# Patient Record
Sex: Female | Born: 1935 | Race: White | Hispanic: No | Smoking: Never smoker
Health system: Southern US, Community
[De-identification: ages and names within clinical notes are randomized; demographics above are authoritative.]

---

## 1998-03-10 ENCOUNTER — Ambulatory Visit: Admission: RE | Admit: 1998-03-10 | Discharge: 1998-03-10 | Payer: Self-pay | Admitting: Obstetrics & Gynecology

## 2005-05-17 ENCOUNTER — Ambulatory Visit: Payer: Self-pay | Admitting: Family Medicine

## 2005-11-02 ENCOUNTER — Ambulatory Visit: Payer: Self-pay | Admitting: Family Medicine

## 2006-11-28 ENCOUNTER — Ambulatory Visit: Payer: Self-pay

## 2006-12-09 ENCOUNTER — Ambulatory Visit: Payer: Self-pay

## 2006-12-10 ENCOUNTER — Ambulatory Visit: Payer: Self-pay | Admitting: Family Medicine

## 2006-12-26 ENCOUNTER — Ambulatory Visit: Payer: Self-pay | Admitting: Surgery

## 2007-01-09 ENCOUNTER — Other Ambulatory Visit: Payer: Self-pay

## 2007-01-15 ENCOUNTER — Inpatient Hospital Stay: Payer: Self-pay | Admitting: Surgery

## 2007-11-10 ENCOUNTER — Ambulatory Visit: Payer: Self-pay | Admitting: Family Medicine

## 2007-12-22 ENCOUNTER — Ambulatory Visit: Payer: Self-pay | Admitting: Family Medicine

## 2008-12-29 ENCOUNTER — Ambulatory Visit: Payer: Self-pay | Admitting: Family Medicine

## 2011-02-07 ENCOUNTER — Ambulatory Visit: Payer: Self-pay | Admitting: Family Medicine

## 2011-09-25 ENCOUNTER — Other Ambulatory Visit: Payer: Self-pay | Admitting: Otolaryngology

## 2011-09-25 DIAGNOSIS — H9209 Otalgia, unspecified ear: Secondary | ICD-10-CM

## 2011-09-26 ENCOUNTER — Ambulatory Visit
Admission: RE | Admit: 2011-09-26 | Discharge: 2011-09-26 | Disposition: A | Discharge status: Home / Self Care | Payer: Medicare Other | Source: Ambulatory Visit | Attending: Otolaryngology | Admitting: Otolaryngology

## 2011-09-26 DIAGNOSIS — H9209 Otalgia, unspecified ear: Secondary | ICD-10-CM

## 2011-09-26 MED ORDER — IOHEXOL 300 MG/ML  SOLN
75.0000 mL | Freq: Once | INTRAMUSCULAR | Status: AC | PRN
  Administered 2011-09-26: 75 mL via INTRAVENOUS

## 2011-10-12 ENCOUNTER — Other Ambulatory Visit: Payer: Self-pay | Admitting: Otolaryngology

## 2011-10-12 DIAGNOSIS — H7010 Chronic mastoiditis, unspecified ear: Secondary | ICD-10-CM

## 2011-10-12 DIAGNOSIS — H9209 Otalgia, unspecified ear: Secondary | ICD-10-CM

## 2011-10-15 ENCOUNTER — Ambulatory Visit
Admission: RE | Admit: 2011-10-15 | Discharge: 2011-10-15 | Disposition: A | Discharge status: Home / Self Care | Payer: Medicare Other | Source: Ambulatory Visit | Attending: Otolaryngology | Admitting: Otolaryngology

## 2011-10-15 DIAGNOSIS — H9209 Otalgia, unspecified ear: Secondary | ICD-10-CM

## 2011-10-15 DIAGNOSIS — H7010 Chronic mastoiditis, unspecified ear: Secondary | ICD-10-CM

## 2011-10-15 MED ORDER — GADOBENATE DIMEGLUMINE 529 MG/ML IV SOLN
13.0000 mL | Freq: Once | INTRAVENOUS | Status: AC | PRN
  Administered 2011-10-15: 13 mL via INTRAVENOUS

## 2011-10-29 ENCOUNTER — Ambulatory Visit: Payer: Self-pay | Admitting: Family Medicine

## 2012-03-12 ENCOUNTER — Inpatient Hospital Stay: Payer: Self-pay | Admitting: Internal Medicine

## 2012-03-12 LAB — COMPREHENSIVE METABOLIC PANEL
Alkaline Phosphatase: 48 U/L — ABNORMAL LOW (ref 50–136)
Anion Gap: 12 (ref 7–16)
Calcium, Total: 9.3 mg/dL (ref 8.5–10.1)
Co2: 26 mmol/L (ref 21–32)
EGFR (African American): >60
EGFR (Non-African Amer.): 58 — ABNORMAL LOW
Osmolality: 276 (ref 275–301)
SGOT(AST): 27 U/L (ref 15–37)
SGPT (ALT): 17 U/L

## 2012-03-12 LAB — CBC WITH DIFFERENTIAL/PLATELET
Basophil #: 0 10*3/uL (ref 0.0–0.1)
Eosinophil #: 0.2 10*3/uL (ref 0.0–0.7)
HCT: 41.3 % (ref 35.0–47.0)
Lymphocyte #: 2.2 10*3/uL (ref 1.0–3.6)
MCHC: 33.7 g/dL (ref 32.0–36.0)
MCV: 87 fL (ref 80–100)
Monocyte #: 1.1 10*3/uL — ABNORMAL HIGH (ref 0.0–0.7)
Monocyte %: 9.5 %
Neutrophil #: 7.8 10*3/uL — ABNORMAL HIGH (ref 1.4–6.5)
Platelet: 448 10*3/uL — ABNORMAL HIGH (ref 150–440)
RDW: 12.8 % (ref 11.5–14.5)
WBC: 11.3 10*3/uL — ABNORMAL HIGH (ref 3.6–11.0)

## 2012-03-13 LAB — CBC WITH DIFFERENTIAL/PLATELET
Basophil #: 0 10*3/uL (ref 0.0–0.1)
Eosinophil #: 0.3 10*3/uL (ref 0.0–0.7)
HCT: 40.4 % (ref 35.0–47.0)
HGB: 13.6 g/dL (ref 12.0–16.0)
Lymphocyte %: 19.9 %
Monocyte %: 11.4 %
Neutrophil #: 7.1 10*3/uL — ABNORMAL HIGH (ref 1.4–6.5)
Neutrophil %: 66 %
RDW: 12.4 % (ref 11.5–14.5)
WBC: 10.7 10*3/uL (ref 3.6–11.0)

## 2012-03-13 LAB — BASIC METABOLIC PANEL
Calcium, Total: 9.5 mg/dL (ref 8.5–10.1)
EGFR (Non-African Amer.): >60
Glucose: 125 mg/dL — ABNORMAL HIGH (ref 65–99)
Osmolality: 282 (ref 275–301)
Potassium: 3.9 mmol/L (ref 3.5–5.1)

## 2012-03-19 DIAGNOSIS — G629 Polyneuropathy, unspecified: Secondary | ICD-10-CM

## 2012-03-19 DIAGNOSIS — Z9089 Acquired absence of other organs: Secondary | ICD-10-CM

## 2012-03-19 DIAGNOSIS — E119 Type 2 diabetes mellitus without complications: Secondary | ICD-10-CM | POA: Insufficient documentation

## 2012-03-19 DIAGNOSIS — H8309 Labyrinthitis, unspecified ear: Secondary | ICD-10-CM | POA: Insufficient documentation

## 2012-03-19 DIAGNOSIS — E785 Hyperlipidemia, unspecified: Secondary | ICD-10-CM | POA: Insufficient documentation

## 2012-03-19 DIAGNOSIS — Z720 Tobacco use: Secondary | ICD-10-CM | POA: Insufficient documentation

## 2012-03-19 DIAGNOSIS — I10 Essential (primary) hypertension: Secondary | ICD-10-CM

## 2012-03-20 ENCOUNTER — Ambulatory Visit (HOSPITAL_COMMUNITY)
Admission: RE | Admit: 2012-03-20 | Discharge: 2012-03-20 | Disposition: A | Discharge status: Home / Self Care | Payer: Medicare Other | Source: Ambulatory Visit | Attending: Internal Medicine | Admitting: Internal Medicine

## 2012-03-20 ENCOUNTER — Other Ambulatory Visit: Payer: Self-pay | Admitting: Licensed Clinical Social Worker

## 2012-03-20 ENCOUNTER — Ambulatory Visit (INDEPENDENT_AMBULATORY_CARE_PROVIDER_SITE_OTHER): Payer: Medicare Other | Admitting: Internal Medicine

## 2012-03-20 ENCOUNTER — Other Ambulatory Visit: Payer: Self-pay | Admitting: Internal Medicine

## 2012-03-20 VITALS — BP 115/71 | HR 81 | Temp 97.7°F | Ht 63.0 in | Wt 133.5 lb

## 2012-03-20 DIAGNOSIS — L0390 Cellulitis, unspecified: Secondary | ICD-10-CM

## 2012-03-20 DIAGNOSIS — H8309 Labyrinthitis, unspecified ear: Secondary | ICD-10-CM

## 2012-03-20 DIAGNOSIS — L0291 Cutaneous abscess, unspecified: Secondary | ICD-10-CM | POA: Insufficient documentation

## 2012-03-20 NOTE — Procedures (Signed)
Procedure : RT SL PICC.  41 cm in length with tip at cavoatrial junction Specimen : none Complications : none

## 2012-03-20 NOTE — Progress Notes (Signed)
  Subjective:    Patient ID: Heidi [name], female    DOB: March 22, 1927, 78 y.o.   MRN: [id number]  HPI Here for periauricular cellulitis.  She has a history of a cholestoma and history of left radical mastoidectomy at age 60 at [facility] and Dr. Ermalinda Barrios did a revision mealoplasty on August 19 2008.  Had recurrent cholesteatoma and revision of left radical mastoidectomy Nov 27 2011.  Very slow to heal and has had difficulty with left auricular cellulitis.  She has been on cipro and gent drops and cipro po for quite a while.  She was recently in [place] and started on vancomycin and aztreonam and noted some improvement and was switched to po therapy and she was discharged.  Unfortunately, she has progressed again and has significant discharge and erythema again of the ear.  Recent CT by their report and from Dr. Dorma Russell is no abscess or bone involvement.     She does have a penicillin allergy but is described as a rash, NOT HIVES.  No known history of cephalosporin use to their knowledge.     Review of Systems  Constitutional: Negative for fever, chills, fatigue and unexpected weight change.  HENT: Positive for ear pain and ear discharge. Negative for facial swelling, neck stiffness, dental problem and tinnitus.   Respiratory: Negative for cough and shortness of breath.   Cardiovascular: Negative for chest pain, palpitations and leg swelling.       Objective:   Physical Exam  Constitutional: She appears well-developed and well-nourished. She appears distressed.       Pain from ear  HENT:       Severe periauricular erythema, pus-like discharge in canal.  Very tender.          Assessment & Plan:

## 2012-03-20 NOTE — Assessment & Plan Note (Signed)
I will start her on cefepime 2 grams bid.  I am most concerned with pseudomonas though MRSA still a possibility.  She will continue with the doxycycline, start the IV med today.  She does not by history have a severe (hives) allergy to penicillin so little cross reactivity.  I am using cefepime for broader coverage including staph and strep as well as GN.  She will be reevaluated on Monday and told to call if she develops a rash.  The next option will be aztreonam if rash develops.  PiCC today at 1:30 and first dose by home health at patients home today.

## 2012-03-23 ENCOUNTER — Telehealth (HOSPITAL_COMMUNITY): Payer: Self-pay | Admitting: Radiology

## 2012-03-24 ENCOUNTER — Telehealth (HOSPITAL_COMMUNITY): Payer: Self-pay

## 2012-03-24 ENCOUNTER — Encounter: Payer: Self-pay | Admitting: Internal Medicine

## 2012-03-24 ENCOUNTER — Ambulatory Visit (INDEPENDENT_AMBULATORY_CARE_PROVIDER_SITE_OTHER): Payer: Medicare Other | Admitting: Internal Medicine

## 2012-03-24 VITALS — BP 165/81 | HR 96 | Temp 98.2°F | Ht 63.0 in | Wt 130.0 lb

## 2012-03-24 DIAGNOSIS — H8309 Labyrinthitis, unspecified ear: Secondary | ICD-10-CM

## 2012-03-24 NOTE — Progress Notes (Signed)
  Subjective:    Patient ID: Heidi [name], female    DOB: 26-Jul-1927, 78 y.o.   MRN: [id number]  HPI Here for follow up of malignant otitis externa, periauricular cellulitis.  No fever or chills.  Started on cefepime at 2 grams q12 hours.  Still with significant pain but did feel that she improved the first day.  No rashes and tolerating the antibiotic well.  History of penicillin allergy but rash, not hives (not severe allergy) and tolerating cephalosporin well.     Review of Systems  Constitutional: Negative for fever and chills.  HENT: Positive for ear discharge.        Still with significant ear pain  Gastrointestinal: Negative for abdominal pain and diarrhea.  Skin: Negative for rash.       Objective:   Physical Exam  Constitutional: She appears well-developed and well-nourished.       Moderate distress with pain  HENT:       Improved area of erythema on the outer part of the ear. Still with signifcant erythema and swelling though.    Skin: No rash noted.          Assessment & Plan:

## 2012-03-24 NOTE — Assessment & Plan Note (Signed)
I am pleased that it is slowly responding to therapy.  I have though asked to do q 8 dosing to try to increase the drug level to the ear and the family is more than willing to do that.  They do have an appointment with Dr. Dorma Russell on Thursday and if continued slow improvement, will continue with the same, if not, I will consider adding vancomycin.  She will return next Wed, sooner though if needed or Friday if not improving.     Also, no signs of allergy.

## 2012-03-31 ENCOUNTER — Encounter: Payer: Self-pay | Admitting: Internal Medicine

## 2012-03-31 ENCOUNTER — Telehealth: Payer: Self-pay | Admitting: *Deleted

## 2012-03-31 ENCOUNTER — Ambulatory Visit (INDEPENDENT_AMBULATORY_CARE_PROVIDER_SITE_OTHER): Payer: Medicare Other | Admitting: Internal Medicine

## 2012-03-31 VITALS — BP 138/74 | HR 76 | Temp 98.0°F | Ht 63.0 in | Wt 131.0 lb

## 2012-03-31 DIAGNOSIS — H8309 Labyrinthitis, unspecified ear: Secondary | ICD-10-CM

## 2012-03-31 MED ORDER — CIPROFLOXACIN-DEXAMETHASONE 0.3-0.1 % OT SUSP: 3.0000 [drp] | Freq: Three times a day (TID) | OTIC | Status: DC

## 2012-03-31 NOTE — Telephone Encounter (Signed)
Gave verbal orders to [place] at [facility] to extend IV antibiotics for one month per Dr. Luciana Axe. Wendall Mola CMA

## 2012-04-01 NOTE — Progress Notes (Signed)
  Subjective:    Patient ID: Heidi [name], female    DOB: Nov 27, 1927, 78 y.o.   MRN: [id number]  HPI here for follow up of her malignant otitis media and periauricular cellulitis.  It continues to be painful but the patient is in much better spirits today.  No fever, chills.  Continues on TID cefepime.  No rashes.     Review of Systems  Constitutional: Negative for fever and chills.  Skin: Negative for rash.       Improving cellulitis       Objective:   Physical Exam  HENT:       Left ear with decreased intensity of the erythema but persists, some decrease in the edema.           Assessment & Plan:

## 2012-04-01 NOTE — Assessment & Plan Note (Signed)
She continues to slowly improve.  She will continue with TID cefepime for now and at the next visit, will change back to BID dosing.     I have discussed the case with colleagues and wound care center, no role or indication for hyperbaric therapy.     She will see ENT next week and me the week following.   She likely will need a 2-3 month course of IV cefepime at least.    I refilled the ciprodex.

## 2012-04-02 ENCOUNTER — Other Ambulatory Visit: Payer: Self-pay | Admitting: *Deleted

## 2012-04-07 ENCOUNTER — Encounter: Payer: Self-pay | Admitting: Internal Medicine

## 2012-04-15 ENCOUNTER — Encounter: Payer: Self-pay | Admitting: Internal Medicine

## 2012-04-15 ENCOUNTER — Ambulatory Visit (INDEPENDENT_AMBULATORY_CARE_PROVIDER_SITE_OTHER): Payer: Medicare Other | Admitting: Internal Medicine

## 2012-04-15 VITALS — BP 124/73 | HR 78 | Temp 97.7°F | Ht 63.0 in | Wt 131.0 lb

## 2012-04-15 DIAGNOSIS — H8309 Labyrinthitis, unspecified ear: Secondary | ICD-10-CM

## 2012-04-15 NOTE — Progress Notes (Signed)
  Subjective:    Patient ID: Heidi [name], female    DOB: 1927-04-22, 78 y.o.   MRN: [id number]  HPI She comes in for followup of her left ear malignant cellulitis. She has been on cefepime now for about one month, including 3 times a day over the last 2 weeks. She continues to follow with ENT weekly, Dr. Tia Masker, and he feels that she has continued to improve. She denies any fever or chills. She continues to feel better subjectively as well. She is accompanied by HER-2 daughters. She denies any fever, chills or rash.   Review of Systems  Constitutional: Negative for fever and chills.  Gastrointestinal: Negative for nausea, vomiting and diarrhea.  Skin: Negative for rash.  Psychiatric/Behavioral: Negative for dysphoric mood. The patient is not nervous/anxious.        Objective:   Physical Exam  Constitutional: She appears well-developed and well-nourished. No distress.  HENT:       Left ear with decreased erythema, decreased edema  Skin: No rash noted.          Assessment & Plan:

## 2012-04-15 NOTE — Assessment & Plan Note (Signed)
This continues to slowly improve. I did discuss with her that often the IV antibiotics require a three-month course at least. That this is a slow healing process.  At the end of 3 months if it is completely resolved or significantly improved, she may be able to be switched to oral therapy again with Cipro. I will have her return in one month and she does get a weekly followup with ENT. I have told them that they can go back to twice a day cefepime.

## 2012-04-16 ENCOUNTER — Encounter: Payer: Self-pay | Admitting: Internal Medicine

## 2012-04-24 ENCOUNTER — Other Ambulatory Visit: Payer: Self-pay | Admitting: Internal Medicine

## 2012-04-30 ENCOUNTER — Telehealth: Payer: Self-pay | Admitting: *Deleted

## 2012-04-30 NOTE — Telephone Encounter (Signed)
Heidi [name] with [facility] called to clarify the patient dose frequency and stop date of her IV antibiotic. After review of the patient last 2 office notes told he she was back to twice a day and provider intended for the patient to stay on the IV antibiotic at least 2-3 months. Having an original start date of 03/21/12 that would put her stop date around 05/21/12 ( ) or 06/21/12 ( ). Noted that the patient has an appt 05/15/12 at which time this will be reassesed and if anything changed we would give her office a call.

## 2012-05-05 ENCOUNTER — Encounter: Payer: Self-pay | Admitting: Internal Medicine

## 2012-05-09 ENCOUNTER — Encounter: Payer: Self-pay | Admitting: Internal Medicine

## 2012-05-12 ENCOUNTER — Inpatient Hospital Stay: Payer: Self-pay | Admitting: Internal Medicine

## 2012-05-12 LAB — COMPREHENSIVE METABOLIC PANEL
Albumin: 2.7 g/dL — ABNORMAL LOW (ref 3.4–5.0)
Alkaline Phosphatase: 43 U/L — ABNORMAL LOW (ref 50–136)
Anion Gap: 10 (ref 7–16)
BUN: 37 mg/dL — ABNORMAL HIGH (ref 7–18)
Calcium, Total: 7.7 mg/dL — ABNORMAL LOW (ref 8.5–10.1)
Chloride: 116 mmol/L — ABNORMAL HIGH (ref 98–107)
Co2: 21 mmol/L (ref 21–32)
Creatinine: 1.81 mg/dL — ABNORMAL HIGH (ref 0.60–1.30)
EGFR (African American): 30 — ABNORMAL LOW
EGFR (Non-African Amer.): 26 — ABNORMAL LOW
Glucose: 111 mg/dL — ABNORMAL HIGH (ref 65–99)
Osmolality: 302 (ref 275–301)
Potassium: 4 mmol/L (ref 3.5–5.1)
SGOT(AST): 24 U/L (ref 15–37)
SGPT (ALT): 12 U/L
Sodium: 147 mmol/L — ABNORMAL HIGH (ref 136–145)
Total Protein: 6.5 g/dL (ref 6.4–8.2)

## 2012-05-12 LAB — CBC
HGB: 10.8 g/dL — ABNORMAL LOW (ref 12.0–16.0)
MCH: 27.7 pg (ref 26.0–34.0)
MCHC: 32 g/dL (ref 32.0–36.0)
RBC: 3.91 10*6/uL (ref 3.80–5.20)
RDW: 14.9 % — ABNORMAL HIGH (ref 11.5–14.5)
WBC: 7.2 10*3/uL (ref 3.6–11.0)

## 2012-05-12 LAB — URINALYSIS, COMPLETE
Bacteria: NONE SEEN
Bilirubin,UR: NEGATIVE
Blood: NEGATIVE
Glucose,UR: NEGATIVE mg/dL (ref 0–75)
Ketone: NEGATIVE
Nitrite: NEGATIVE
Ph: 6 (ref 4.5–8.0)
RBC,UR: 1 /HPF (ref 0–5)
Specific Gravity: 1.011 (ref 1.003–1.030)
Squamous Epithelial: <1
WBC UR: 2 /HPF (ref 0–5)

## 2012-05-12 LAB — TSH: Thyroid Stimulating Horm: 7.28 u[IU]/mL — ABNORMAL HIGH

## 2012-05-12 LAB — TROPONIN I: Troponin-I: <0.02 ng/mL

## 2012-05-12 LAB — CK TOTAL AND CKMB (NOT AT ARMC): CK, Total: 21 U/L (ref 21–215)

## 2012-05-13 ENCOUNTER — Telehealth: Payer: Self-pay | Admitting: *Deleted

## 2012-05-13 LAB — BASIC METABOLIC PANEL
Calcium, Total: 7.8 mg/dL — ABNORMAL LOW (ref 8.5–10.1)
Chloride: 114 mmol/L — ABNORMAL HIGH (ref 98–107)
Co2: 22 mmol/L (ref 21–32)
Creatinine: 1.75 mg/dL — ABNORMAL HIGH (ref 0.60–1.30)
EGFR (Non-African Amer.): 27 — ABNORMAL LOW
Glucose: 106 mg/dL — ABNORMAL HIGH (ref 65–99)
Potassium: 4.2 mmol/L (ref 3.5–5.1)
Sodium: 144 mmol/L (ref 136–145)

## 2012-05-13 NOTE — Telephone Encounter (Signed)
Patient's daughter called she is currently admitted at [facility], for ongoing nausea and left sided facial swelling.  Not sure if she will be discharged by Thursday when she has follow up with Dr. Luciana Axe.  Advised her to sign a release so the records could be sent to this clinic.   She will call if Mother can not keep appointment. Wendall Mola CMA

## 2012-05-14 LAB — BASIC METABOLIC PANEL
Calcium, Total: 7.4 mg/dL — ABNORMAL LOW (ref 8.5–10.1)
Chloride: 115 mmol/L — ABNORMAL HIGH (ref 98–107)
Creatinine: 1.56 mg/dL — ABNORMAL HIGH (ref 0.60–1.30)
EGFR (African American): 35 — ABNORMAL LOW
Potassium: 3.8 mmol/L (ref 3.5–5.1)
Sodium: 146 mmol/L — ABNORMAL HIGH (ref 136–145)

## 2012-05-15 ENCOUNTER — Ambulatory Visit: Payer: Medicare Other | Admitting: Internal Medicine

## 2012-05-15 DIAGNOSIS — I517 Cardiomegaly: Secondary | ICD-10-CM

## 2012-05-15 LAB — BASIC METABOLIC PANEL
Anion Gap: 8 (ref 7–16)
BUN: 30 mg/dL — ABNORMAL HIGH (ref 7–18)
Creatinine: 1.55 mg/dL — ABNORMAL HIGH (ref 0.60–1.30)
EGFR (Non-African Amer.): 31 — ABNORMAL LOW
Glucose: 177 mg/dL — ABNORMAL HIGH (ref 65–99)

## 2012-05-17 LAB — BASIC METABOLIC PANEL
BUN: 29 mg/dL — ABNORMAL HIGH (ref 7–18)
Calcium, Total: 7.7 mg/dL — ABNORMAL LOW (ref 8.5–10.1)
Co2: 23 mmol/L (ref 21–32)
Creatinine: 1.15 mg/dL (ref 0.60–1.30)
EGFR (African American): 51 — ABNORMAL LOW

## 2012-05-19 ENCOUNTER — Other Ambulatory Visit: Payer: Self-pay

## 2012-05-19 LAB — COMPREHENSIVE METABOLIC PANEL
BUN: 14 mg/dL (ref 7–18)
Bilirubin,Total: 0.5 mg/dL (ref 0.2–1.0)
Calcium, Total: 7.9 mg/dL — ABNORMAL LOW (ref 8.5–10.1)
Chloride: 108 mmol/L — ABNORMAL HIGH (ref 98–107)
Co2: 28 mmol/L (ref 21–32)
Creatinine: 0.88 mg/dL (ref 0.60–1.30)
EGFR (Non-African Amer.): >60
Osmolality: 292 (ref 275–301)
Potassium: 3 mmol/L — ABNORMAL LOW (ref 3.5–5.1)
Total Protein: 6.5 g/dL (ref 6.4–8.2)

## 2012-05-19 LAB — CBC WITH DIFFERENTIAL/PLATELET
Eosinophil #: 0.3 10*3/uL (ref 0.0–0.7)
Eosinophil %: 5.1 %
Lymphocyte #: 1.1 10*3/uL (ref 1.0–3.6)
Lymphocyte %: 17.7 %
MCHC: 32 g/dL (ref 32.0–36.0)
MCV: 86 fL (ref 80–100)
Monocyte #: 0.7 x10 3/mm (ref 0.2–0.9)
Neutrophil #: 4 10*3/uL (ref 1.4–6.5)
Platelet: 266 10*3/uL (ref 150–440)
RBC: 3.64 10*6/uL — ABNORMAL LOW (ref 3.80–5.20)
RDW: 15.3 % — ABNORMAL HIGH (ref 11.5–14.5)

## 2012-05-19 LAB — CK: CK, Total: 50 U/L (ref 21–215)

## 2012-05-20 ENCOUNTER — Telehealth: Payer: Self-pay | Admitting: *Deleted

## 2012-05-20 NOTE — Telephone Encounter (Signed)
Heidi [name], pt's dtr called [phone] . States her mom got out of hospital Saturday and she was to be seen within 1-2 days for f/u. She has an appt in [place] tomorrow & wants Dr. Luciana Axe to see her so she does not have to make 2 trips here from [place]. Our md does not have anything until Friday am. messge to md to see if he wants her seen tomorrow or give her the Friday slot.   She states her mom is very weak & does not think she can make it 2 days of trips out of the house

## 2012-05-20 NOTE — Telephone Encounter (Signed)
We have an opening tomorrow at 4pm. Heidi [name] will put her in this slot. I spoke with the pt's dtr & she said that was ok. She will just bring her back to [place] by 4pm tomorrow

## 2012-05-21 ENCOUNTER — Telehealth: Payer: Self-pay | Admitting: *Deleted

## 2012-05-21 ENCOUNTER — Encounter: Payer: Self-pay | Admitting: Internal Medicine

## 2012-05-21 ENCOUNTER — Ambulatory Visit: Payer: Medicare Other | Admitting: Internal Medicine

## 2012-05-21 ENCOUNTER — Ambulatory Visit (INDEPENDENT_AMBULATORY_CARE_PROVIDER_SITE_OTHER): Payer: Medicare Other | Admitting: Internal Medicine

## 2012-05-21 VITALS — BP 182/70 | HR 77 | Temp 98.2°F | Ht 63.0 in | Wt 129.0 lb

## 2012-05-21 DIAGNOSIS — H6090 Unspecified otitis externa, unspecified ear: Secondary | ICD-10-CM

## 2012-05-21 DIAGNOSIS — H60399 Other infective otitis externa, unspecified ear: Secondary | ICD-10-CM

## 2012-05-21 DIAGNOSIS — H6020 Malignant otitis externa, unspecified ear: Secondary | ICD-10-CM | POA: Insufficient documentation

## 2012-05-21 NOTE — Assessment & Plan Note (Signed)
This has markedly improved since her last visit and I am pleased with the response. Since her creatinine has improved to normal I will readjust her cefepime dose to the full dose of 2 mg twice a day. Certainly with the improvement I am pleased and I do feel this is a right therapy, though I will continue with the cefepime for another month. I also will arrange for a gadolinium scan to be done in about 3 weeks to see if there is residual inflammation and how much. If that is improved in a month I may consider stopping the IV therapy and just continue with oral Cipro. I have discussed this plan with the daughters and the patient.

## 2012-05-21 NOTE — Progress Notes (Signed)
  Subjective:    Patient ID: Heidi [name], female    DOB: [birth year], 78 y.o.   MRN: [id number]  HPI She comes in for followup of malignant otitis externa. She was recently hospitalized due to dehydration and acute renal failure. Her creatinine now though is back in the normal range after hospitalization in [place] and hydration. She continues to cefepime though I had him stop the doxycycline to 2 nausea. She denies fever or chills. She is accompanied by HER-2 daughters. She continues to take the cefepime. This was renally adjusted during the hospitalization. She does have problems with abdominal pain which she is receiving Reglan and Zofran 4.   Review of Systems  Constitutional: Positive for fatigue. Negative for fever, chills and unexpected weight change.  HENT: Positive for hearing loss, ear pain and ear discharge.        Improved ear pain and discharge  Eyes: Negative.   Gastrointestinal: Positive for nausea, abdominal pain and constipation. Negative for vomiting and diarrhea.  Skin: Negative for rash.       Improved ear erythema       Objective:   Physical Exam  Constitutional: She appears well-developed and well-nourished. No distress.  HENT:       Erythema of the outer ear and surrounding her inner ear is much improved.          Assessment & Plan:

## 2012-05-21 NOTE — Telephone Encounter (Signed)
Called [facility] and spoke with Melissa in pharmacy to change IV antibiotic orders from every 8 hours to twice daily per Dr. Luciana Axe.  Extended orders for one month until patient's follow up appointment. Wendall Mola CMA

## 2012-06-07 ENCOUNTER — Other Ambulatory Visit: Payer: Self-pay

## 2012-06-07 LAB — COMPREHENSIVE METABOLIC PANEL
Albumin: 3.2 g/dL — ABNORMAL LOW (ref 3.4–5.0)
Anion Gap: 8 (ref 7–16)
Calcium, Total: 8.7 mg/dL (ref 8.5–10.1)
Co2: 25 mmol/L (ref 21–32)
EGFR (Non-African Amer.): >60
Glucose: 108 mg/dL — ABNORMAL HIGH (ref 65–99)
Osmolality: 280 (ref 275–301)
Potassium: 5.3 mmol/L — ABNORMAL HIGH (ref 3.5–5.1)
Sodium: 139 mmol/L (ref 136–145)
Total Protein: 7 g/dL (ref 6.4–8.2)

## 2012-06-07 LAB — CBC WITH DIFFERENTIAL/PLATELET
Basophil #: 0.1 10*3/uL (ref 0.0–0.1)
Basophil %: 0.7 %
Eosinophil %: 6 %
HCT: 34.8 % — ABNORMAL LOW (ref 35.0–47.0)
MCH: 28.1 pg (ref 26.0–34.0)
MCHC: 32.5 g/dL (ref 32.0–36.0)
MCV: 87 fL (ref 80–100)
Monocyte #: 0.9 x10 3/mm (ref 0.2–0.9)
Monocyte %: 11.5 %
Neutrophil #: 4.7 10*3/uL (ref 1.4–6.5)
Platelet: 345 10*3/uL (ref 150–440)
RDW: 16.2 % — ABNORMAL HIGH (ref 11.5–14.5)
WBC: 7.9 10*3/uL (ref 3.6–11.0)

## 2012-06-07 LAB — SEDIMENTATION RATE: Erythrocyte Sed Rate: 13 mm/hr (ref 0–30)

## 2012-06-10 ENCOUNTER — Telehealth: Payer: Self-pay | Admitting: *Deleted

## 2012-06-10 NOTE — Telephone Encounter (Signed)
Patient is scheduled for Nuclear studies at [facility] for 06/17/12, 06/18/12, and 06/19/12 at 1:00 pm each day.  She is aware of appt dates and time. Wendall Mola CMA

## 2012-06-11 ENCOUNTER — Encounter: Payer: Self-pay | Admitting: Internal Medicine

## 2012-06-17 ENCOUNTER — Ambulatory Visit (HOSPITAL_COMMUNITY): Payer: Medicare Other

## 2012-06-17 ENCOUNTER — Inpatient Hospital Stay (HOSPITAL_COMMUNITY): Admission: RE | Admit: 2012-06-17 | Payer: Medicare Other | Source: Ambulatory Visit

## 2012-06-18 ENCOUNTER — Encounter (HOSPITAL_COMMUNITY): Payer: Medicare Other

## 2012-06-19 ENCOUNTER — Other Ambulatory Visit: Payer: Self-pay | Admitting: Internal Medicine

## 2012-06-19 ENCOUNTER — Encounter (HOSPITAL_COMMUNITY): Payer: Medicare Other

## 2012-06-20 ENCOUNTER — Telehealth: Payer: Self-pay | Admitting: *Deleted

## 2012-06-20 ENCOUNTER — Encounter: Payer: Self-pay | Admitting: Internal Medicine

## 2012-06-20 NOTE — Telephone Encounter (Signed)
Melissa with [facility] called, patient's IV antibiotics end today. Her next appt is 06/30/12, note to MD should PICC be pulled or antibiotics continue.  For now gave verbal order to flush PICC line. Wendall Mola CMA

## 2012-06-20 NOTE — Telephone Encounter (Signed)
Continue with the antibiotics until I see her. thanks

## 2012-06-20 NOTE — Telephone Encounter (Signed)
[facility] notified. 

## 2012-06-27 ENCOUNTER — Encounter: Payer: Self-pay | Admitting: Internal Medicine

## 2012-06-30 ENCOUNTER — Telehealth: Payer: Self-pay | Admitting: *Deleted

## 2012-06-30 ENCOUNTER — Encounter: Payer: Self-pay | Admitting: Internal Medicine

## 2012-06-30 ENCOUNTER — Ambulatory Visit (INDEPENDENT_AMBULATORY_CARE_PROVIDER_SITE_OTHER): Payer: Medicare Other | Admitting: Internal Medicine

## 2012-06-30 VITALS — BP 179/77 | HR 67 | Temp 97.5°F | Ht 63.0 in | Wt 132.0 lb

## 2012-06-30 DIAGNOSIS — H6020 Malignant otitis externa, unspecified ear: Secondary | ICD-10-CM

## 2012-06-30 NOTE — Assessment & Plan Note (Signed)
Unfortunately, after it appears she was improving on the cefepime she again is having worsening symptoms. Clinically her exam shows worsening erythema and edema. Per ENT, she also has notably worsened though not evidence of any abscess for drainage. No Pseudomonas is overwhelmingly the most common cause, certainly staph aureus is a possibility as well as fungal organisms, particularly aspergillus. The fungal organisms though, are typically in patients with T-cell deficiency such as those receiving chemotherapy or with HIV.  With the severity of this infection and the potential for serious outcomes, I will have this patient changed to a different antipseudomonal therapy with meropenem and I will add IV vancomycin for the possibility of MRSA. She has had response to vancomycin the past when she was initially hospitalized in [place]. Though she did initially seem to be improving with cefepime so Pseudomonas is still highest in the differential. This does not significantly improve over the next 3-4 weeks or continues to worsen, fungal organism will be considered and she may need to be started on voriconazole.    She is going to see ENT next week and I will see her about 3 weeks from now. Her home health agency has been notified of the antibiotic changes.

## 2012-06-30 NOTE — Telephone Encounter (Signed)
Call to [facility] and gave verbal order for Vancomycin 1 gram every 12 hours per pharmacy protocol and Merepenem 1 gram every eight hours. Vanc trough between 15-20. Antibiotics x 4 weeks with weekly CBC and CMP labs.  CRP and ESR lab in one month.  Order also faxed to [phone]. Wendall Mola CMA

## 2012-06-30 NOTE — Progress Notes (Signed)
  Subjective:    Patient ID: Heidi [name], female    DOB: 10-11-1927, 78 y.o.   MRN: [id number]  HPI She comes in here for followup of her malignant otitis externa. She has been continuing on cefepime for presumed pseudomonal cause. She was hospitalized prior to her last appointment and was noted to be severely dehydrated and nauseous and her doxycycline was stopped. Unfortunately, her ear clinically has worsened again and she has been seen by Dr. Lovey Newcomer, her ENT, who did note significant worsening. She was due to have a gadolinium scan, however since it is worsening, this will be postponed.  She does note worsening pain, no fever or chills.   Review of Systems  Constitutional: Negative for fever, chills, appetite change and fatigue.  HENT: Positive for ear pain and ear discharge.   Gastrointestinal: Negative for nausea, abdominal pain, diarrhea and constipation.  Neurological: Negative for dizziness and light-headedness.       Objective:   Physical Exam  Constitutional: She appears well-developed and well-nourished. No distress.  HENT:       I do note worsening erythema of the ear and some increased edema.          Assessment & Plan:

## 2012-07-02 ENCOUNTER — Other Ambulatory Visit: Payer: Self-pay

## 2012-07-02 LAB — COMPREHENSIVE METABOLIC PANEL
Alkaline Phosphatase: 101 U/L (ref 50–136)
BUN: 33 mg/dL — ABNORMAL HIGH (ref 7–18)
Bilirubin,Total: 0.2 mg/dL (ref 0.2–1.0)
Creatinine: 1.02 mg/dL (ref 0.60–1.30)
SGPT (ALT): 15 U/L
Sodium: 138 mmol/L (ref 136–145)
Total Protein: 7.4 g/dL (ref 6.4–8.2)

## 2012-07-02 LAB — CBC WITH DIFFERENTIAL/PLATELET
Eosinophil %: 4 %
HCT: 35.3 % (ref 35.0–47.0)
Lymphocyte #: 1.3 10*3/uL (ref 1.0–3.6)
MCV: 86 fL (ref 80–100)
Monocyte %: 10.9 %
Neutrophil #: 7.8 10*3/uL — ABNORMAL HIGH (ref 1.4–6.5)
RBC: 4.12 10*6/uL (ref 3.80–5.20)
WBC: 10.8 10*3/uL (ref 3.6–11.0)

## 2012-07-08 ENCOUNTER — Telehealth: Payer: Self-pay | Admitting: Licensed Clinical Social Worker

## 2012-07-08 NOTE — Telephone Encounter (Signed)
Please advise patient to slow down her rate of infusion if possible. Can take benadryl to help with rash. If it persists despite these changes, please have her call us back in clinic.

## 2012-07-08 NOTE — Telephone Encounter (Signed)
Patient's daughter Larene Beach called stating that the patient has red palms today and she is currently on Vancomycin. It started yesterday and the redness is lighter today. She does not seem to have any other symptoms per the daughter. Daughter would like to know if changes needs to be made with the Vancomycin. Please advise.

## 2012-07-09 ENCOUNTER — Encounter: Payer: Self-pay | Admitting: Internal Medicine

## 2012-07-11 ENCOUNTER — Telehealth: Payer: Self-pay | Admitting: Otolaryngology

## 2012-07-14 ENCOUNTER — Telehealth: Payer: Self-pay | Admitting: Licensed Clinical Social Worker

## 2012-07-14 NOTE — Telephone Encounter (Signed)
Patient has a picc line that is unable to get blood from it, the nurse would like an order to do cath flow at home, insurance will not cover this and the patient's daughter has agreed to pay out of pocket for the nurse to do the procedure at home. [facility] to [facility].

## 2012-07-14 NOTE — Telephone Encounter (Signed)
Patient went to her ENT and they suggested that she call our office to be referred to a pain management clinic due to the increase pain in ear. Please advise.

## 2012-07-22 ENCOUNTER — Encounter: Payer: Self-pay | Admitting: Internal Medicine

## 2012-07-22 ENCOUNTER — Ambulatory Visit (INDEPENDENT_AMBULATORY_CARE_PROVIDER_SITE_OTHER): Payer: Medicare Other | Admitting: Internal Medicine

## 2012-07-22 VITALS — BP 162/72 | HR 74 | Temp 98.1°F | Ht 63.0 in | Wt 122.0 lb

## 2012-07-22 DIAGNOSIS — H6020 Malignant otitis externa, unspecified ear: Secondary | ICD-10-CM

## 2012-07-22 NOTE — Assessment & Plan Note (Signed)
She continues to have significant pain and erythema. Overall it is difficult to tell this point if it is improving on and change of therapy. I would not expect a significant change at this point yet. She will continue with the same. Her inflammatory markers including her sedimentation rate and CRP remained stable though mildly elevated. She will continue with his antibiotics. As noted previously, there is a chance of it being fungal as well though this would be very unusual. She has had some improvement in the discharge and therefore this time we'll continue to observe her on the antibacterial antibiotics. If there is any worsening though or no improvement in the next few weeks, voriconazole may be added.  They are considering as well going to a neck edematous and her for any further management. They will call to let me know of any changes in her therapy.

## 2012-07-22 NOTE — Progress Notes (Signed)
  Subjective:    Patient ID: Heidi [name], female    DOB: [birth year], 78 y.o.   MRN: [id number]  HPI She comes in here for followup for her malignant otitis externa. She continues to have significant pain and she has started the new regimen of vancomycin and imipenem. No recent fever or chills. Her ENT physician has found some less discharge overall. She is getting the medicine well.   Review of Systems  Constitutional: Negative for fever and chills.  HENT: Positive for ear pain and ear discharge. Negative for tinnitus.   Gastrointestinal: Negative for abdominal pain and diarrhea.  Psychiatric/Behavioral: Positive for confusion.       Objective:   Physical Exam  Constitutional: She appears well-developed and well-nourished. No distress.  HENT:       Significant erythema and swelling of the left ear that is unchanged from previous          Assessment & Plan:

## 2012-07-23 ENCOUNTER — Encounter: Payer: Self-pay | Admitting: Internal Medicine

## 2012-07-23 ENCOUNTER — Telehealth: Payer: Self-pay | Admitting: *Deleted

## 2012-07-23 NOTE — Telephone Encounter (Signed)
Referral sent to [facility] and received a fax back they declined to see patient, stating nothing to offer this patient.  Called and spoke with April and she stated they do not see patient's for pain from ear infections.  Notified patient's daughter Heidi [name] and she said they would pursue other options. Wendall Mola CMA

## 2012-07-24 ENCOUNTER — Inpatient Hospital Stay: Payer: Self-pay | Admitting: Specialist

## 2012-07-24 LAB — COMPREHENSIVE METABOLIC PANEL
Albumin: 3.1 g/dL — ABNORMAL LOW (ref 3.4–5.0)
Anion Gap: 11 (ref 7–16)
Bilirubin,Total: 0.4 mg/dL (ref 0.2–1.0)
Calcium, Total: 8.9 mg/dL (ref 8.5–10.1)
Co2: 26 mmol/L (ref 21–32)
Osmolality: 284 (ref 275–301)
Potassium: 3.4 mmol/L — ABNORMAL LOW (ref 3.5–5.1)
SGOT(AST): 20 U/L (ref 15–37)
Sodium: 139 mmol/L (ref 136–145)

## 2012-07-24 LAB — CBC
HGB: 11.3 g/dL — ABNORMAL LOW (ref 12.0–16.0)
MCV: 82 fL (ref 80–100)
Platelet: 453 10*3/uL — ABNORMAL HIGH (ref 150–440)
RBC: 4.02 10*6/uL (ref 3.80–5.20)
RDW: 13.6 % (ref 11.5–14.5)
WBC: 14.7 10*3/uL — ABNORMAL HIGH (ref 3.6–11.0)

## 2012-07-25 LAB — COMPREHENSIVE METABOLIC PANEL
Calcium, Total: 8.6 mg/dL (ref 8.5–10.1)
Chloride: 105 mmol/L (ref 98–107)
Co2: 28 mmol/L (ref 21–32)
EGFR (African American): 51 — ABNORMAL LOW
EGFR (Non-African Amer.): 44 — ABNORMAL LOW
SGOT(AST): 16 U/L (ref 15–37)
SGPT (ALT): 8 U/L — ABNORMAL LOW

## 2012-07-25 LAB — CBC WITH DIFFERENTIAL/PLATELET
Basophil %: 0.2 %
Eosinophil %: 2 %
HCT: 29.9 % — ABNORMAL LOW (ref 35.0–47.0)
Lymphocyte #: 1.3 10*3/uL (ref 1.0–3.6)
MCH: 27.7 pg (ref 26.0–34.0)
MCV: 82 fL (ref 80–100)
Monocyte #: 1.3 x10 3/mm — ABNORMAL HIGH (ref 0.2–0.9)
Neutrophil #: 9.1 10*3/uL — ABNORMAL HIGH (ref 1.4–6.5)
Platelet: 420 10*3/uL (ref 150–440)
RBC: 3.64 10*6/uL — ABNORMAL LOW (ref 3.80–5.20)

## 2012-07-26 LAB — PHENYTOIN LEVEL, TOTAL
Dilantin: 35.9 ug/mL (ref 10.0–20.0)
Dilantin: 20.9 ug/mL — ABNORMAL HIGH (ref 10.0–20.0)

## 2012-07-27 LAB — CBC WITH DIFFERENTIAL/PLATELET
Basophil %: 0.1 %
HCT: 28.2 % — ABNORMAL LOW (ref 35.0–47.0)
HGB: 9.4 g/dL — ABNORMAL LOW (ref 12.0–16.0)
Lymphocyte %: 9.3 %
Monocyte %: 9.3 %
Neutrophil #: 10.3 10*3/uL — ABNORMAL HIGH (ref 1.4–6.5)
Neutrophil %: 78.7 %
RBC: 3.4 10*6/uL — ABNORMAL LOW (ref 3.80–5.20)
WBC: 13.1 10*3/uL — ABNORMAL HIGH (ref 3.6–11.0)

## 2012-07-27 LAB — URINALYSIS, COMPLETE
Bacteria: NONE SEEN
Bilirubin,UR: NEGATIVE
Blood: NEGATIVE
Glucose,UR: 50 mg/dL (ref 0–75)
Ketone: NEGATIVE
Nitrite: NEGATIVE
Specific Gravity: 1.004 (ref 1.003–1.030)
Squamous Epithelial: NONE SEEN

## 2012-07-27 LAB — PHENYTOIN LEVEL, TOTAL: Dilantin: 14.4 ug/mL (ref 10.0–20.0)

## 2012-07-29 LAB — CULTURE, BLOOD (SINGLE)

## 2012-07-31 ENCOUNTER — Telehealth: Payer: Self-pay | Admitting: *Deleted

## 2012-07-31 NOTE — Telephone Encounter (Signed)
Call from pharmacist at skilled nursing facility.  Patient was discharged from [facility] with a change in IV antibiotics. She is now on Vancomycin 750 mg every 24 hours and Fortaz 1 gram every 8 hours.  Her labs showed a worsening renal function and pharmacy was wondering about adjusting the vanc dose.  I told her that it is usually per pharmacy protocol, but because Dr. Luciana Axe was not the prescriber, I would send him a note.  Please advise, pharmacist April [phone]. Heidi [name] CMA

## 2012-08-01 NOTE — Telephone Encounter (Signed)
Yes, adjust the dose per pharmacy protocol.  Thanks

## 2012-08-01 NOTE — Telephone Encounter (Signed)
Pharmacist notified.

## 2012-08-22 ENCOUNTER — Telehealth: Payer: Self-pay | Admitting: *Deleted

## 2012-08-22 NOTE — Telephone Encounter (Signed)
Called patient to remind her of appt 08/26/12 and daughter said it was not necessary as she has malignant cancer and is not expected to live another 3 weeks, she is under hospice care. Wendall Mola CMA

## 2012-08-26 ENCOUNTER — Ambulatory Visit: Payer: Medicare Other | Admitting: Internal Medicine

## 2012-09-23 DEATH — deceased

## 2013-05-31 IMAGING — CT CT MAXILLOFACIAL WITH CONTRAST
1 series · 15 of 30 positions shown, 19 images · non-contrast
Comparison: none

REASON FOR EXAM: L ear swelling and painful; s/p radical mastoidectomy
[DATE] and at age 9; ENT co
COMMENTS:

PROCEDURE:     CT  - CT MAXILLOFACIAL AREA W  - March 12, 2012  [DATE]
RESULT:     History: Swelling pain. Prior mastoid surgery.
Comparison Study: No recent.

[Series 3: soft tissue · axial · 0.35mm/px · z∈[+346,+490]mm · 15 of 52 slices shown, 19 images]
[im 2/52  brain]
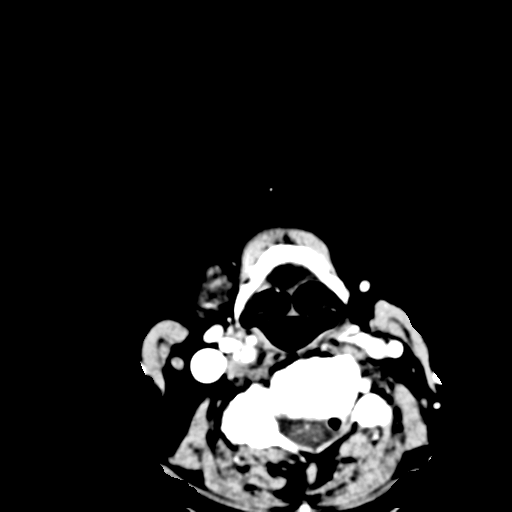
[im 2/52  bone]
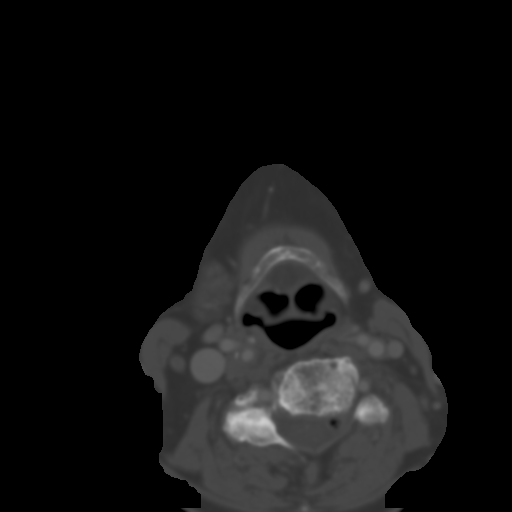
[im 6/52  bone]
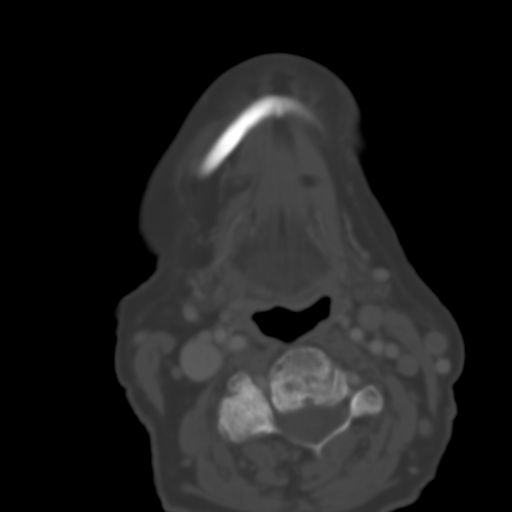
[im 9/52  bone]
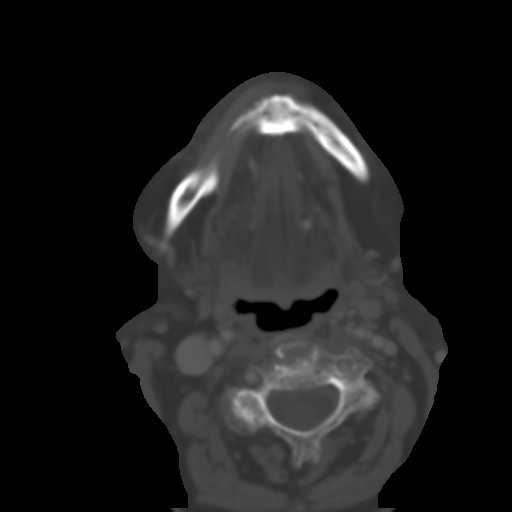
[im 13/52  bone]
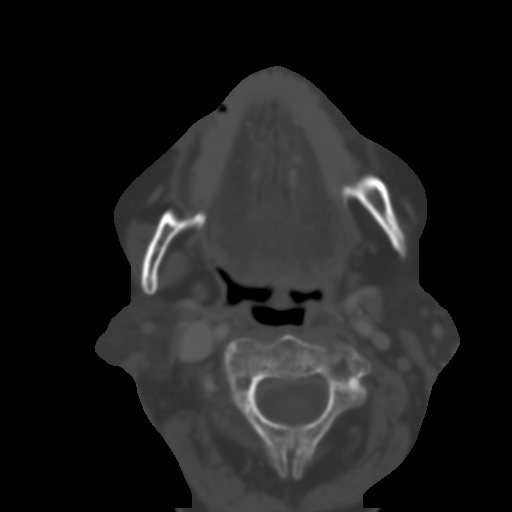
[im 16/52  brain]
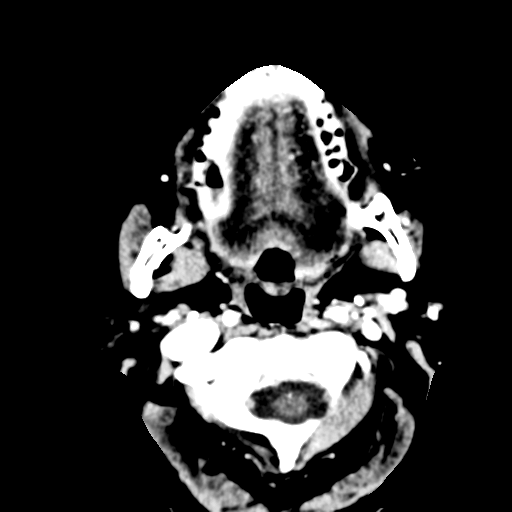
[im 16/52  bone]
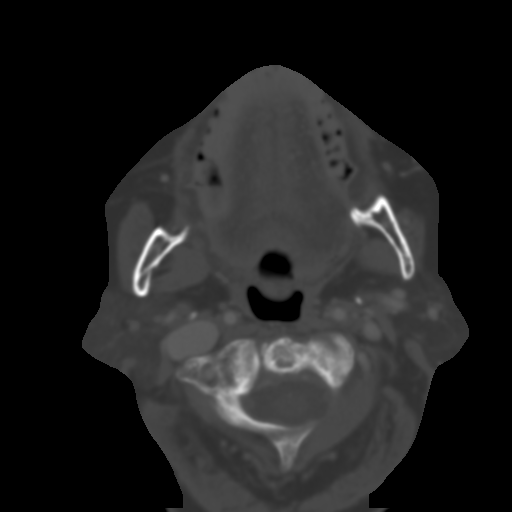
[im 20/52  bone]
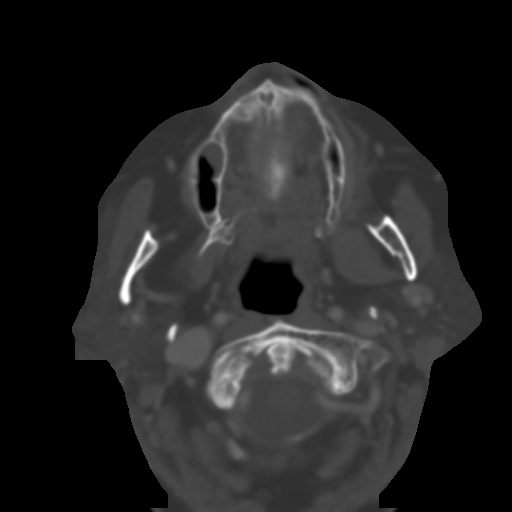
[im 23/52  bone]
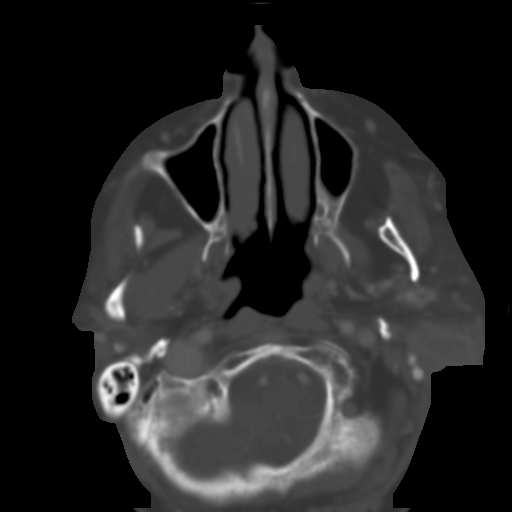
[im 27/52  bone]
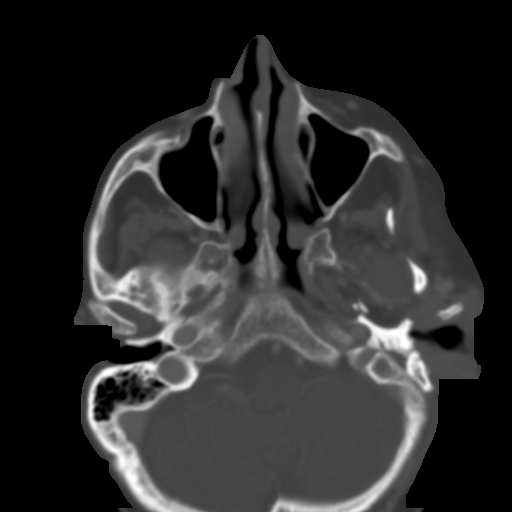
[im 29/52  brain]
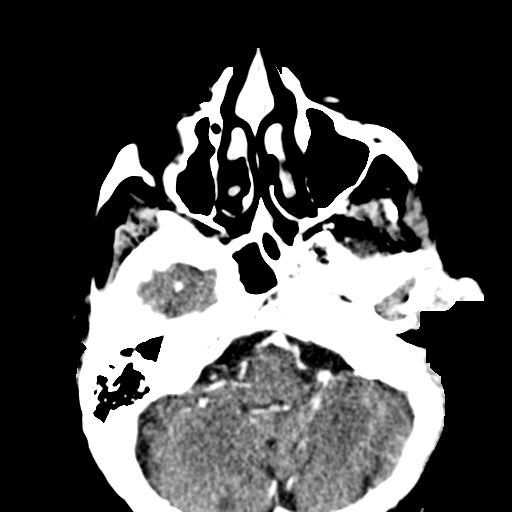
[im 29/52  bone]
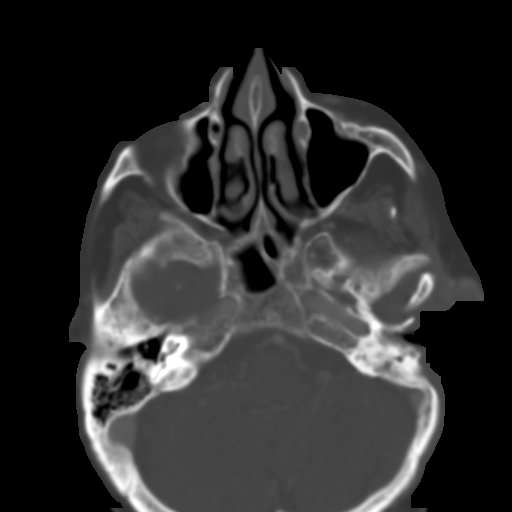
[im 32/52  bone]
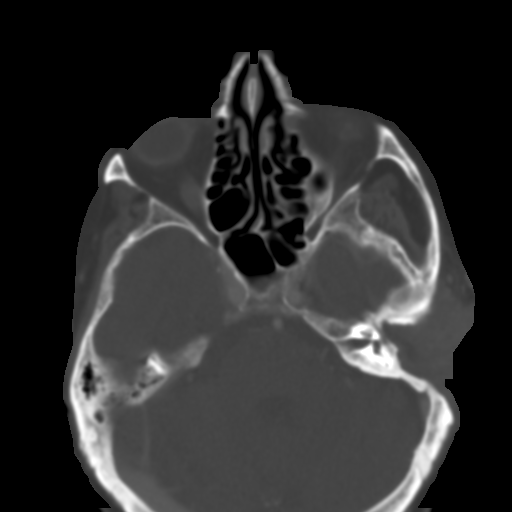
[im 36/52  bone]
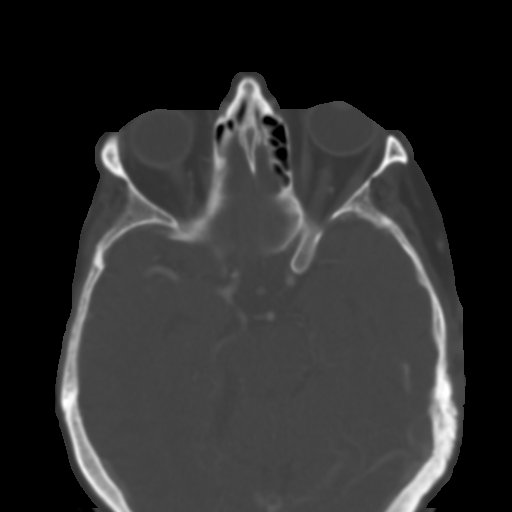
[im 39/52  bone]
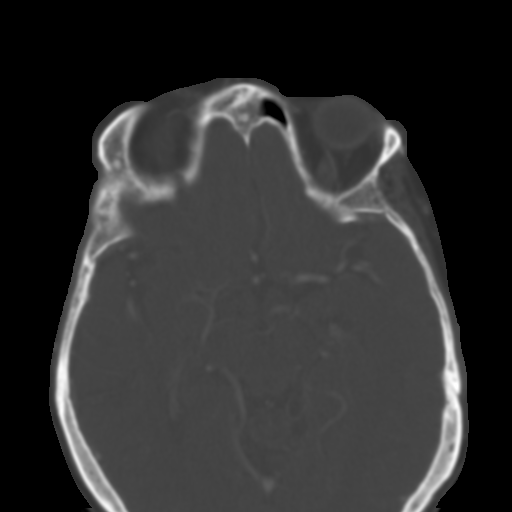
[im 43/52  brain]
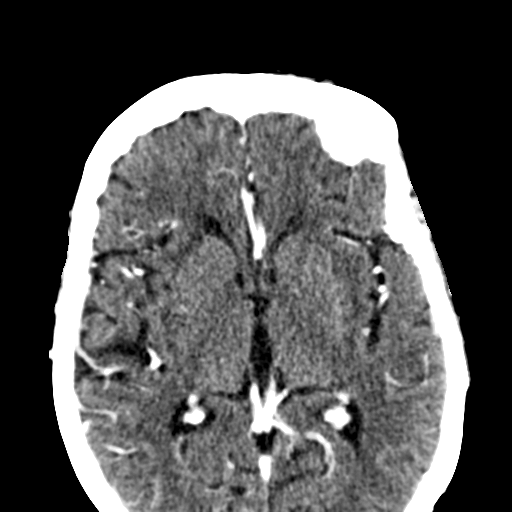
[im 43/52  bone]
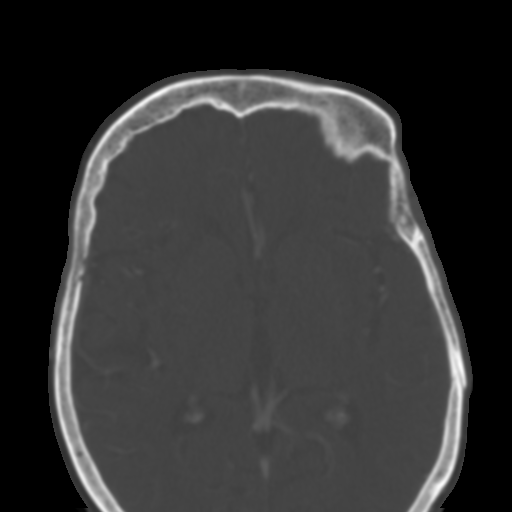
[im 46/52  bone]
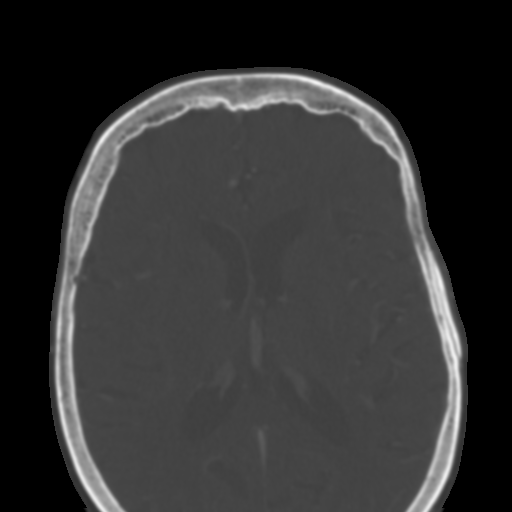
[im 50/52  bone]
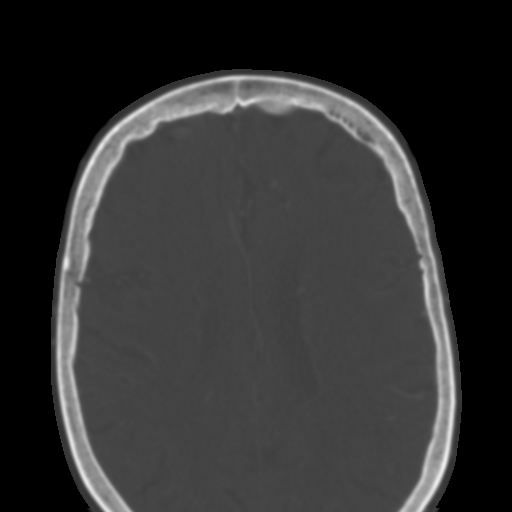

[15 of 30 positions shown; findings below may reference images not displayed]

FINDINGS: Standard CT obtained with 75 cc of Isovue 300. Large soft tissue
mass/ swelling is noted about the left external auditory canal. Soft tissue
These changes could be inflammatory, malignant, or postsurgical. No evidence
of abscess. Paranasal sinuses and orbits are intact. Nasopharynx is normal.
Base of the tongue is normal. Salivary glands are normal. Prominent cervical
lymph nodes are noted in the left anterior cervical chain. Postsurgical
changes noted about the left mastoid consistent with left mastoidectomy. No
acute bony abnormality
IMPRESSION: Extensive soft tissue swelling/ possible mass lesion about
the left external auditory canal as described above. Cervical lymph
adenopathy. No abscess. Postsurgical changes are noted about the left
mastoid consistent with a prior mastoidectomy. No acute bony lesions noted.

## 2013-07-31 IMAGING — CR DG ABDOMEN 1V
1 series · 2 of 2 positions shown · non-contrast
Comparison: none

REASON FOR EXAM: SBO nauea
COMMENTS:

[Series 1: x abdomen supine · 0.14mm/px · 2 of 2 slices shown]
[im 1/2]
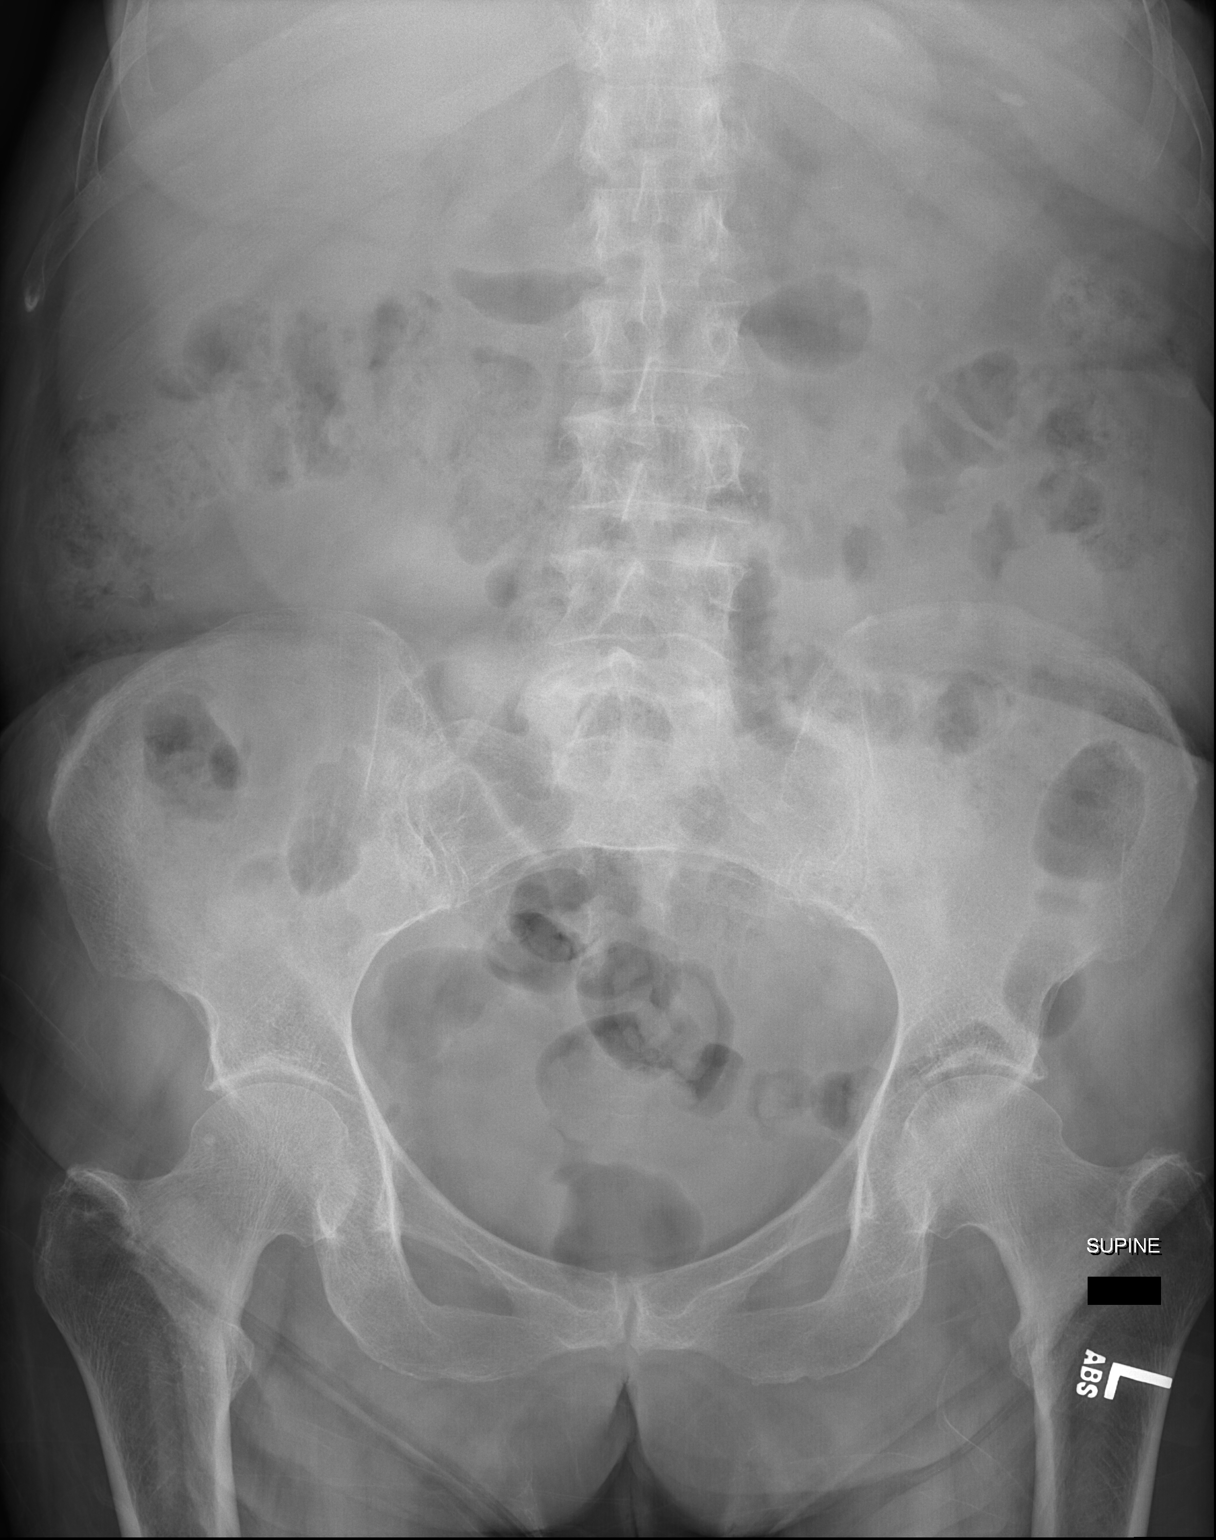
[im 2/2]
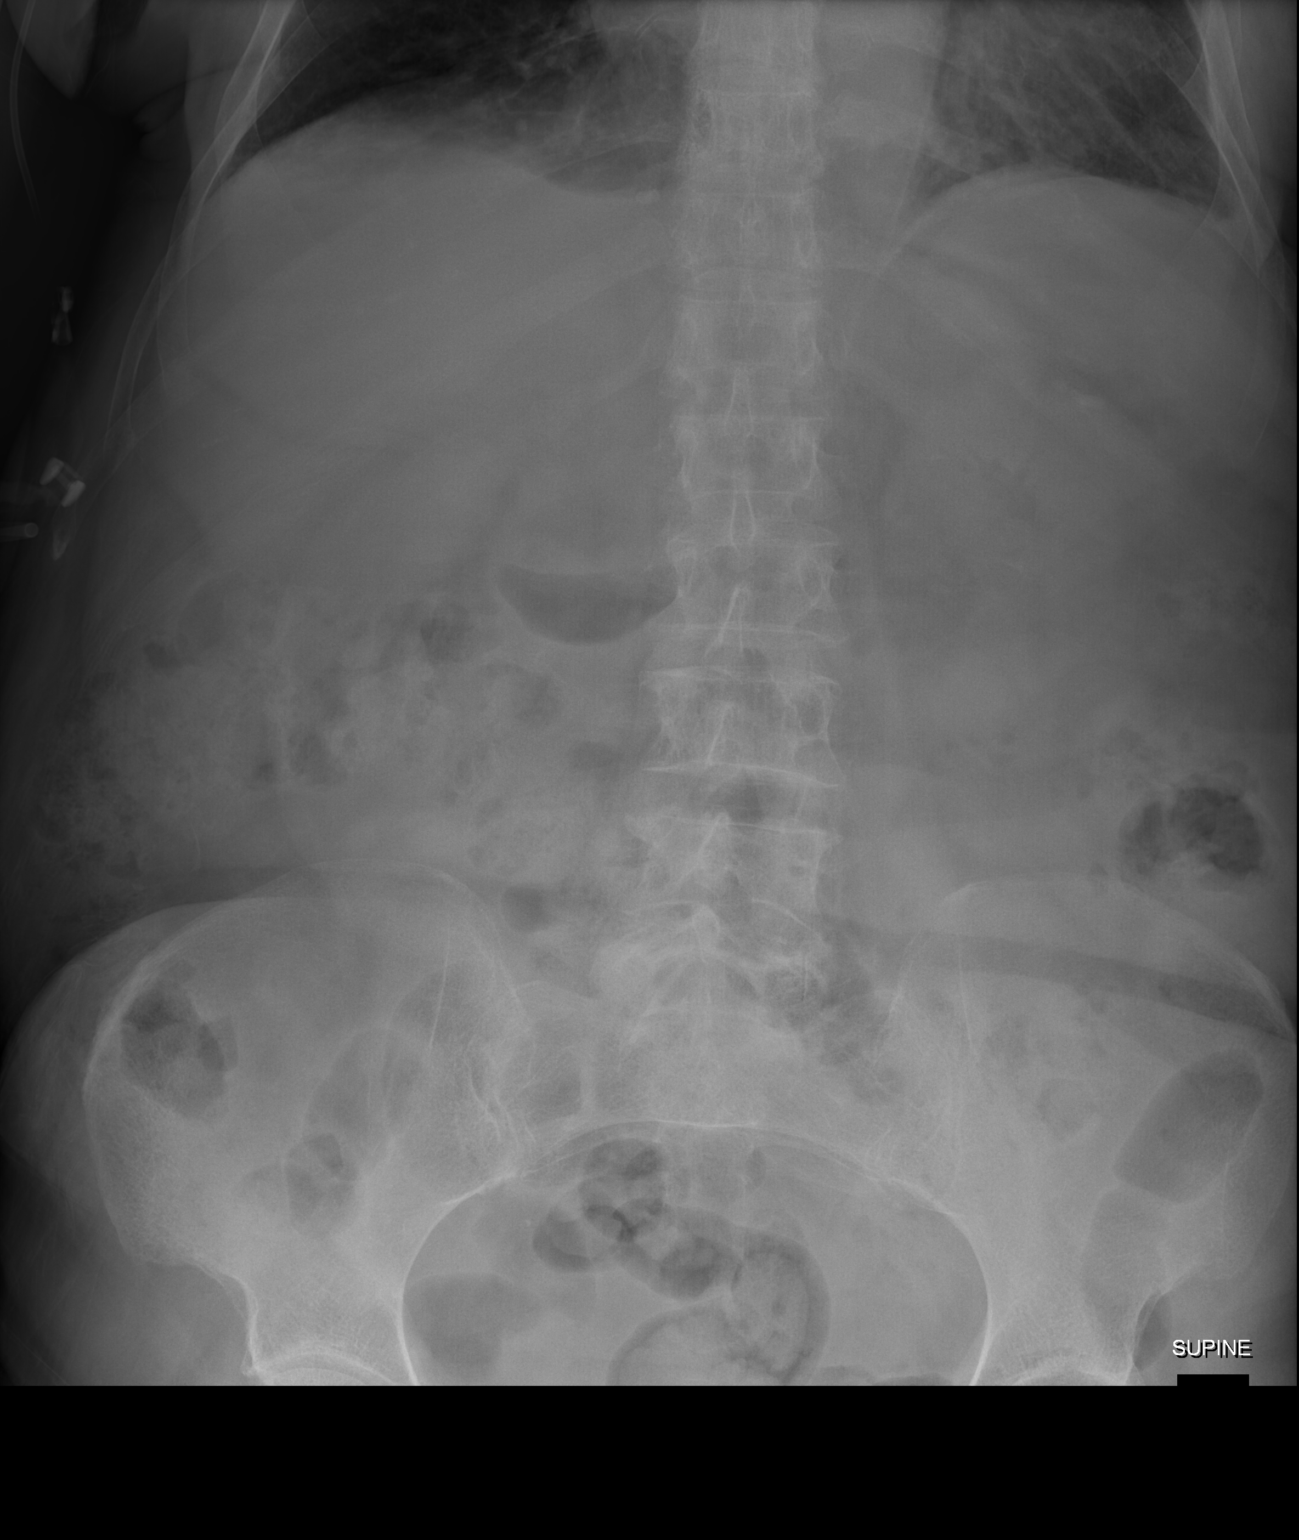

[2 of 2 positions shown; findings below may reference images not displayed]

PROCEDURE:     DXR - DXR KIDNEY URETER BLADDER  - May 12, 2012  [DATE]

RESULT:     Abdominal images demonstrate air and fecal material scattered
through the colon to the rectum. Degenerative changes are present in the
spine. There is no evidence of bowel obstruction. No definite renal stones
are evident.
IMPRESSION: 1.     No evidence of bowel obstruction or perforation.

[REDACTED]

## 2013-07-31 IMAGING — CR DG CHEST 1V PORT
1 series · 1 of 1 positions shown · non-contrast
Comparison: none

REASON FOR EXAM: weakness confusion, decreased O2 sats
COMMENTS:

[x chest ap]
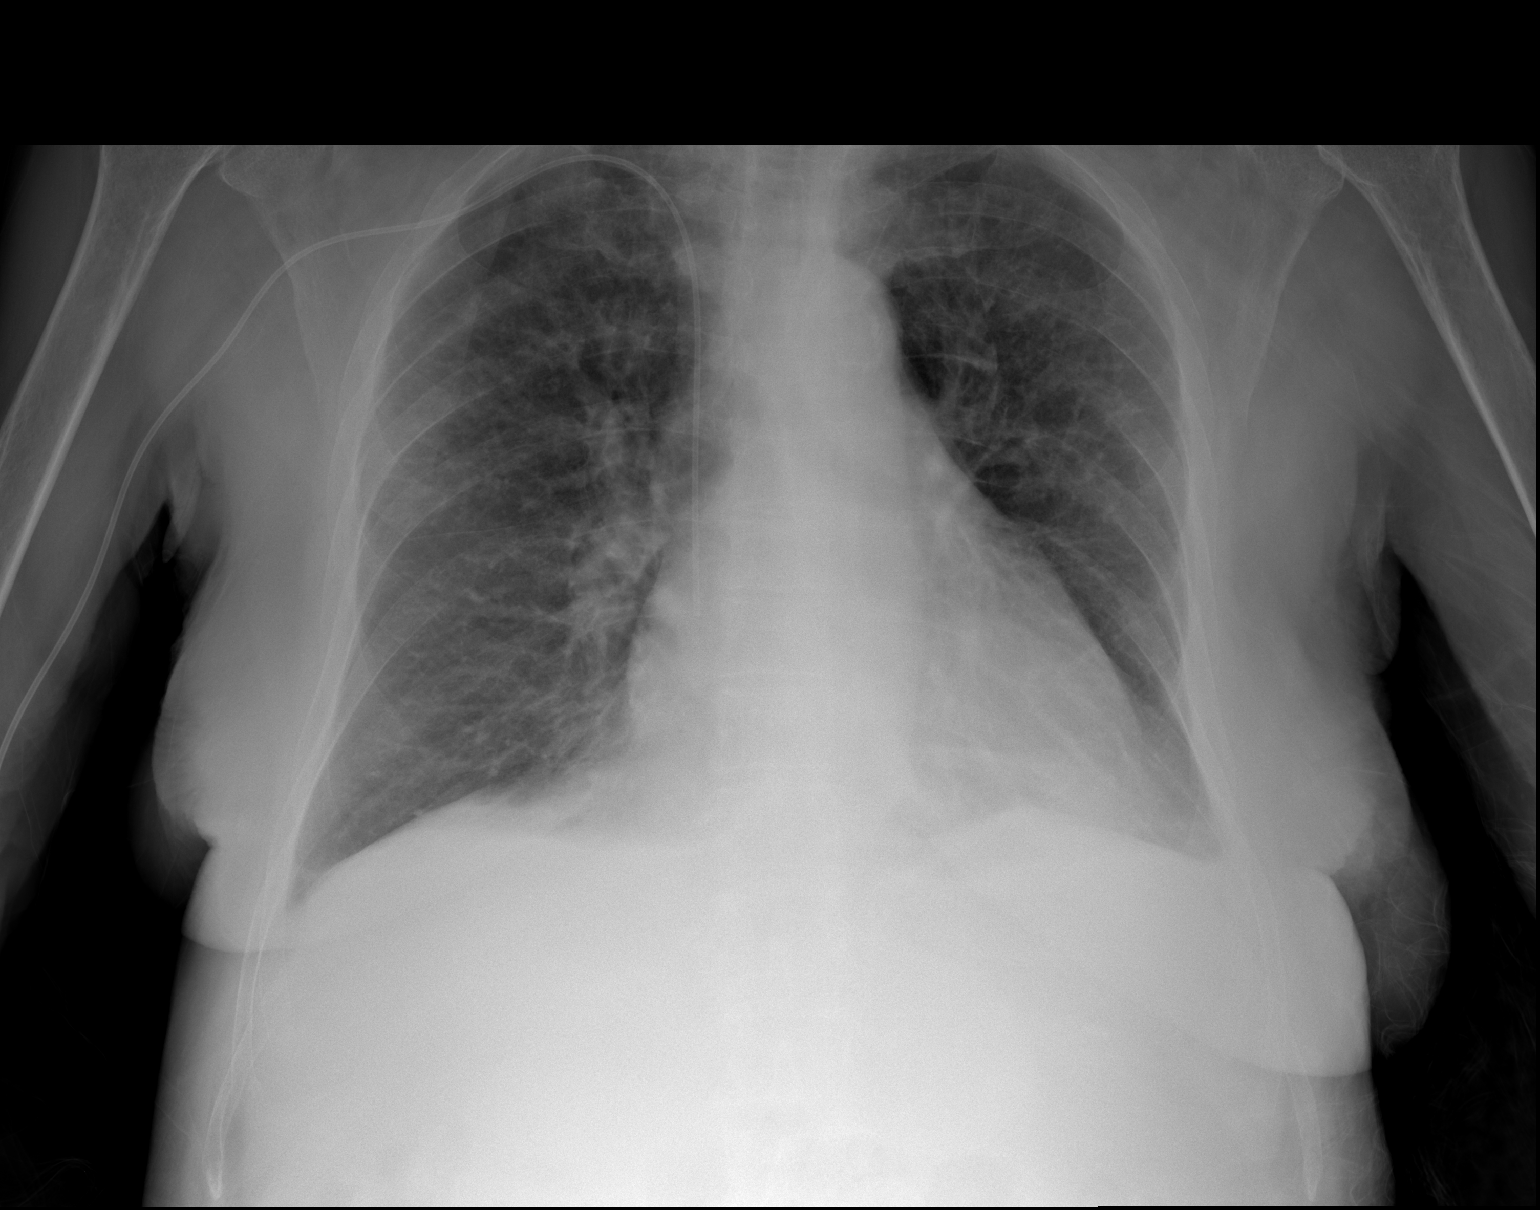

[1 of 1 positions shown; findings below may reference images not displayed]

PROCEDURE:     DXR - DXR PORTABLE CHEST SINGLE VIEW  - May 12, 2012  [DATE]

RESULT:     Comparison is made to the study 29 October, 2011.

There is pulmonary vascular congestion with diffuse interstitial edema. The
cardiac silhouette is borderline enlarged. Peripherally inserted central
venous catheter appears present with the tip in the right atrium. No
effusion or pneumothorax is present. The bony structures are intact.
IMPRESSION: 1.     Peripherally inserted central venous catheter present with changes of
interstitial edema. Interstitial pneumonitis is not excluded. Correlate with
clinical and laboratory data. Borderline to mild cardiomegaly suspected.

[REDACTED]

## 2013-08-02 IMAGING — CR DG CHEST 1V PORT
1 series · 1 of 1 positions shown · non-contrast
Comparison: none

REASON FOR EXAM: hypoxia
COMMENTS:

[x chest ap]
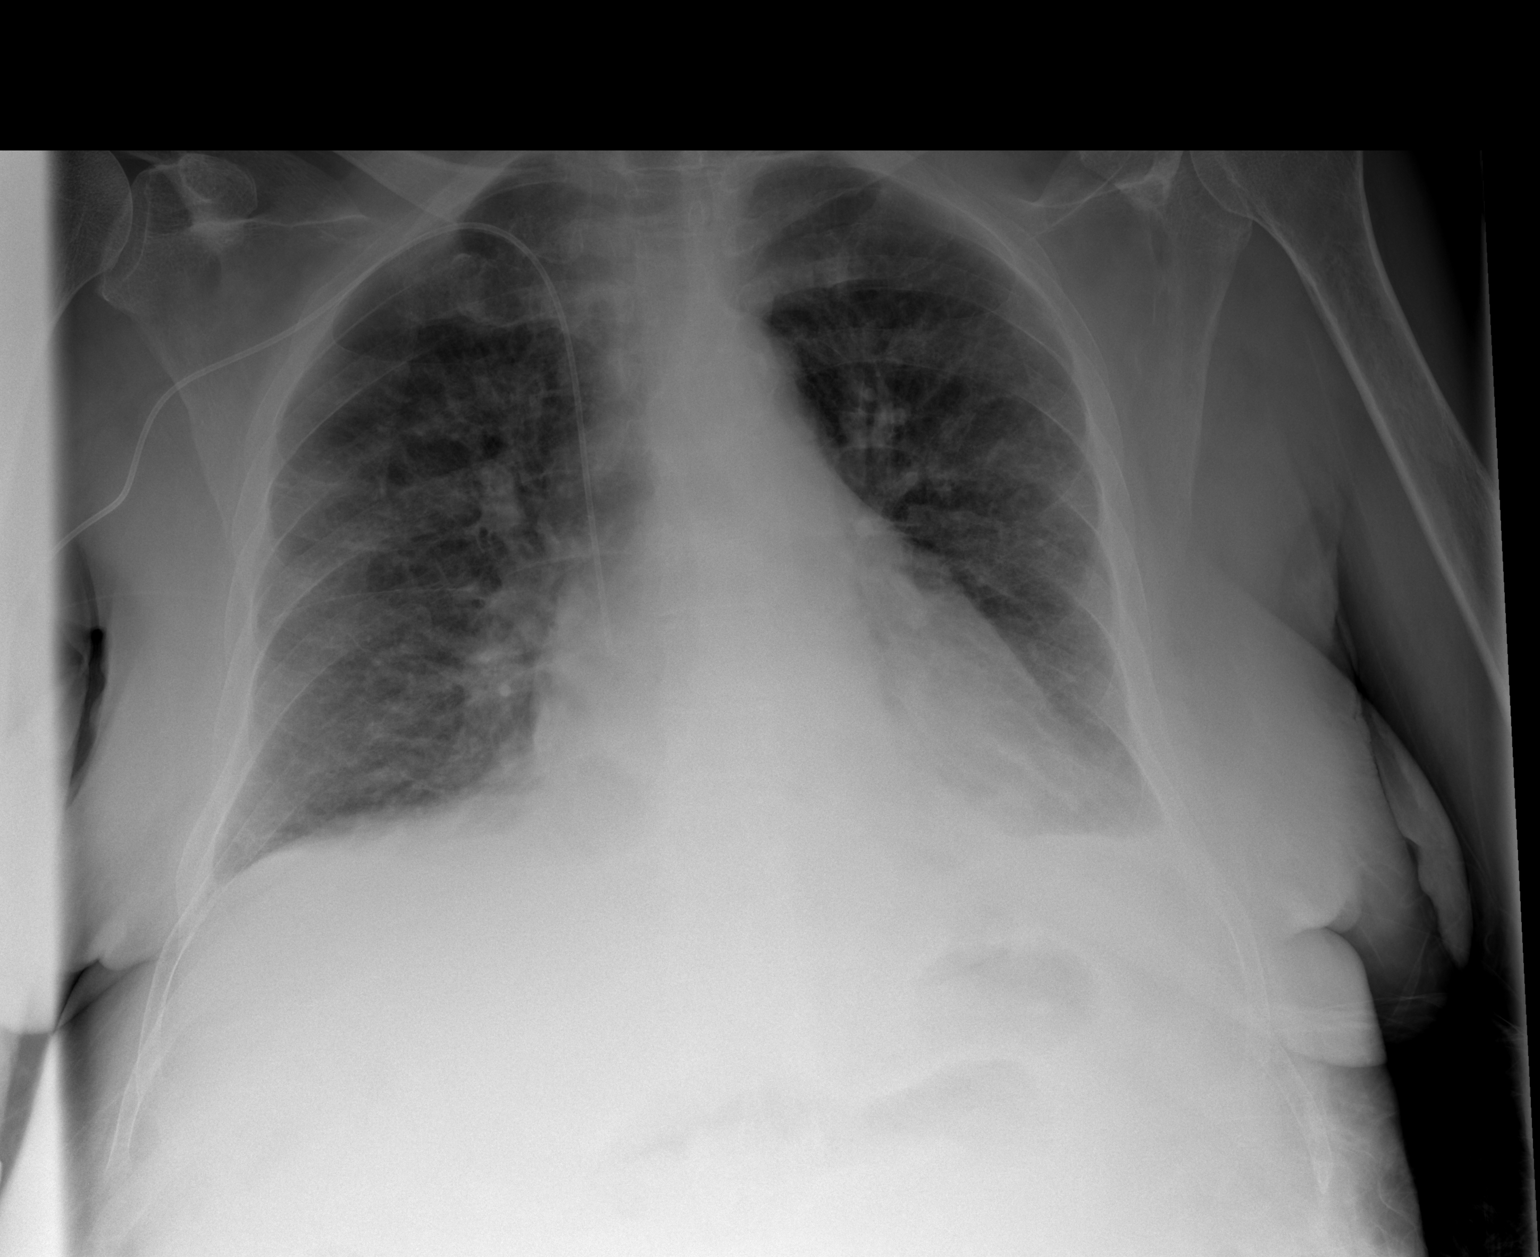

[1 of 1 positions shown; findings below may reference images not displayed]

PROCEDURE:     DXR - DXR PORTABLE CHEST SINGLE VIEW  - May 14, 2012  [DATE]

RESULT:     Comparison is made to the previous study 12 May, 2012.

There appears to be a right-sided peripherally inserted central catheter
with the tip of the catheter in the right atrium. Pulmonary interstitial
prominence and pulmonary vascular congestion is present. The cardiac
silhouette appears mildly enlarged. There is no significant effusion. There
is no pneumothorax or focal consolidation. The bony structures appear
unremarkable.
IMPRESSION: 1.     Findings can represent diffuse interstitial edema or interstitial
pneumonitis. Mild cardiomegaly appears present.

[REDACTED]

## 2013-08-03 IMAGING — US ABDOMEN ULTRASOUND
1 series · 14 of 25 positions shown · non-contrast
Comparison: none

REASON FOR EXAM: abdomninal  pain.nausea
COMMENTS:

[Series 1: abdomen ultrasound · 0.28mm/px · 14 of 67 slices shown]
[im 1/67]
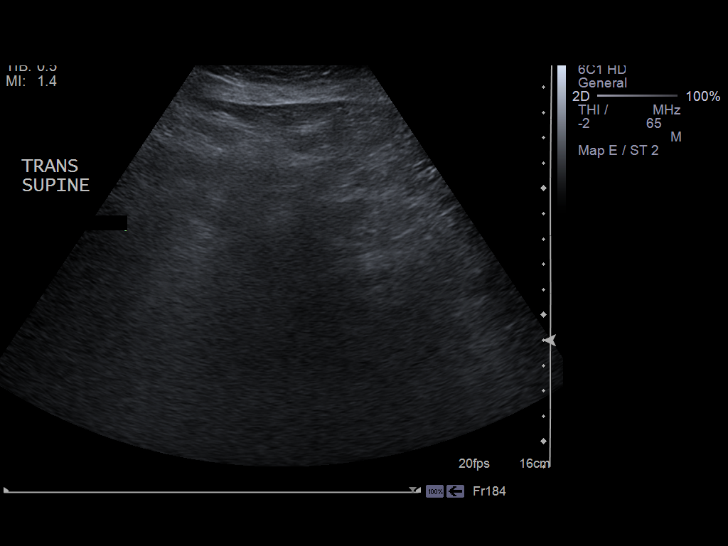
[im 6/67]
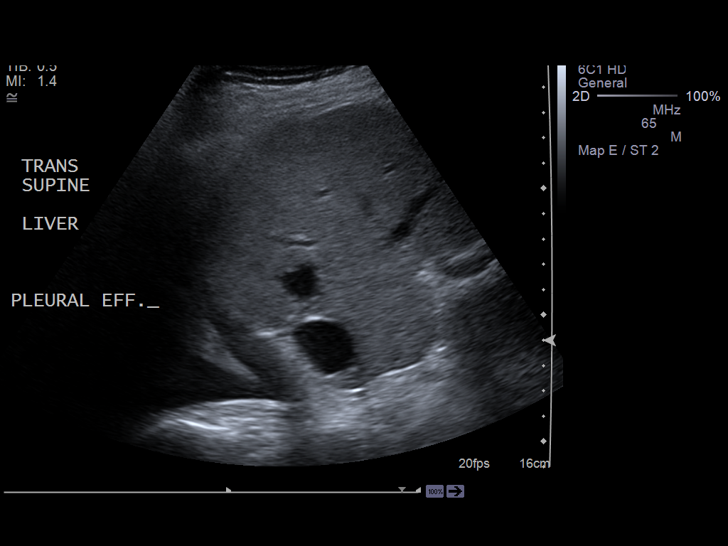
[im 12/67]
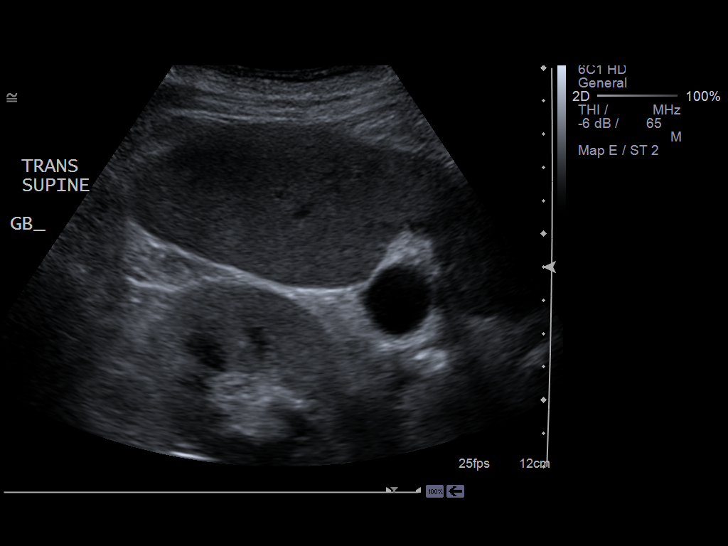
[im 17/67]
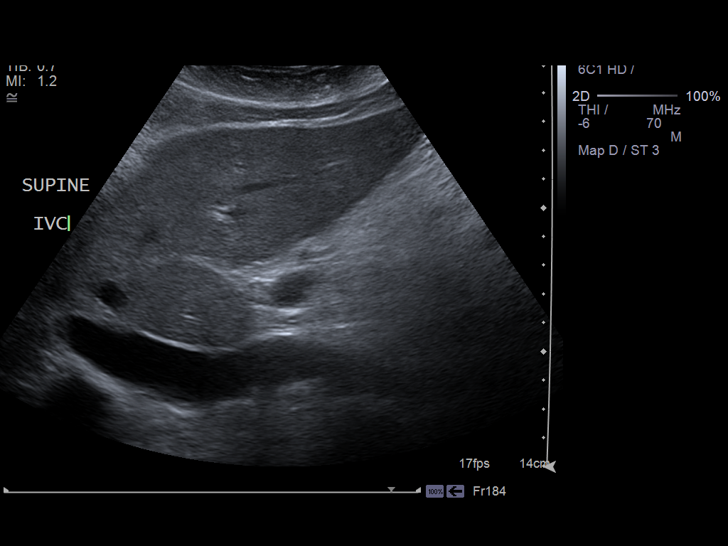
[im 23/67]
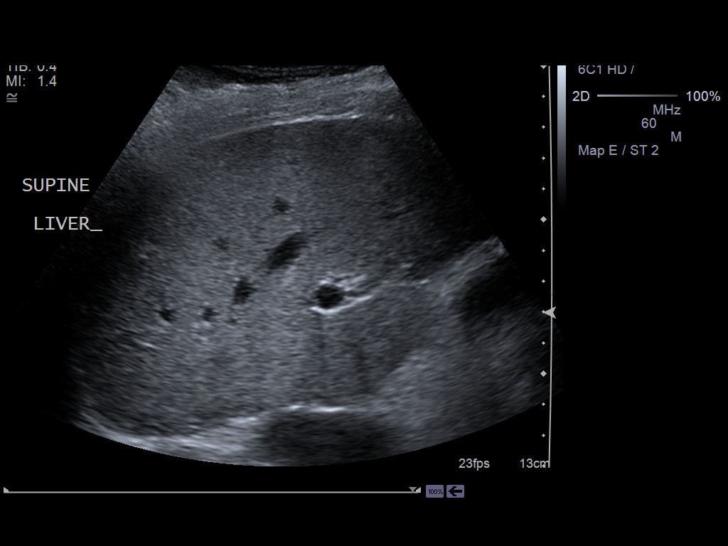
[im 25/67]
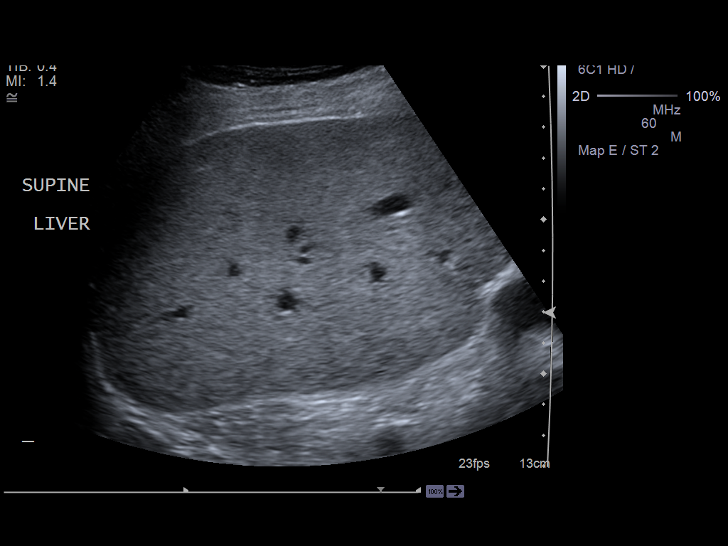
[im 31/67]
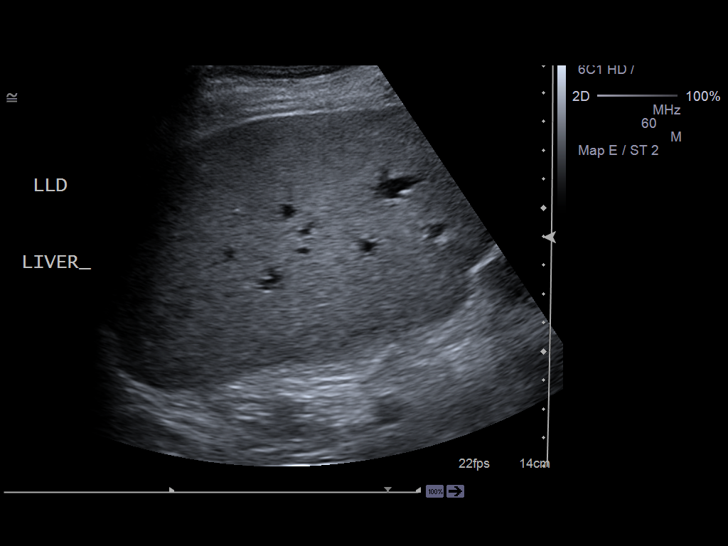
[im 36/67]
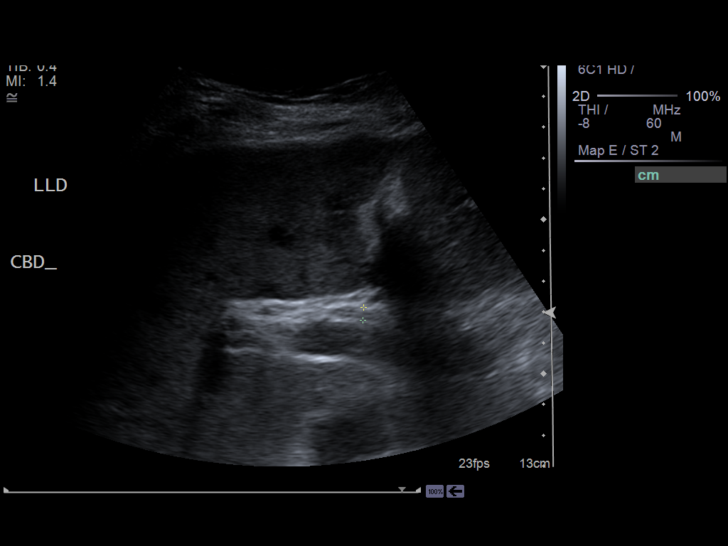
[im 42/67]
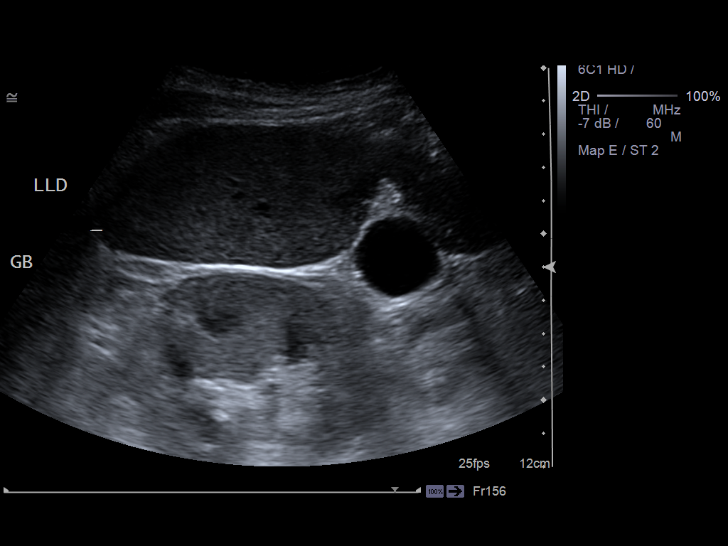
[im 45/67]
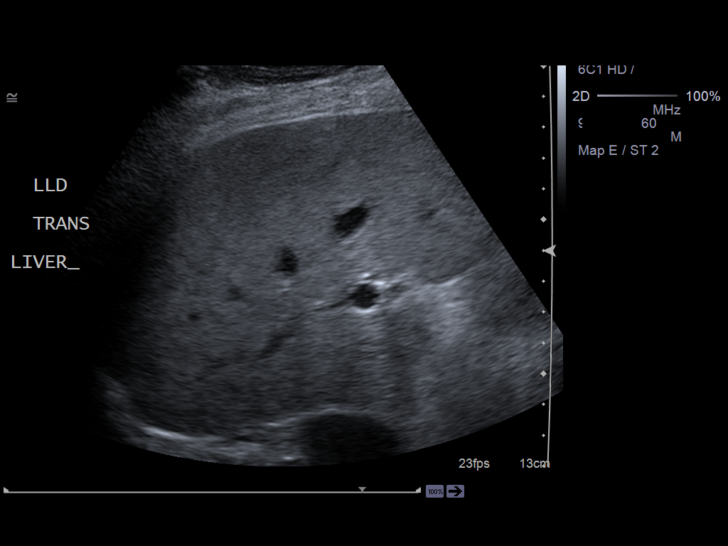
[im 50/67]
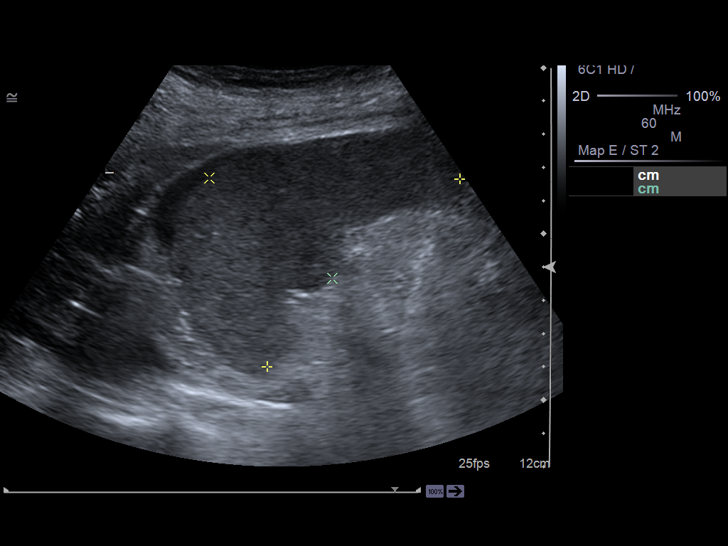
[im 56/67]
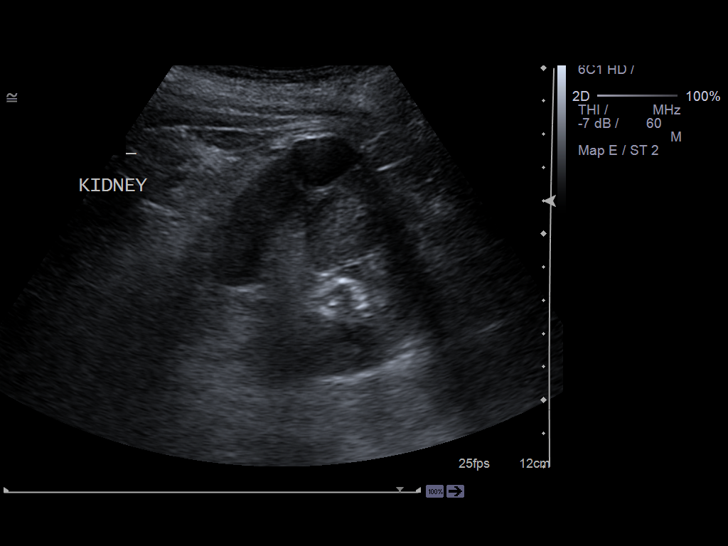
[im 61/67]
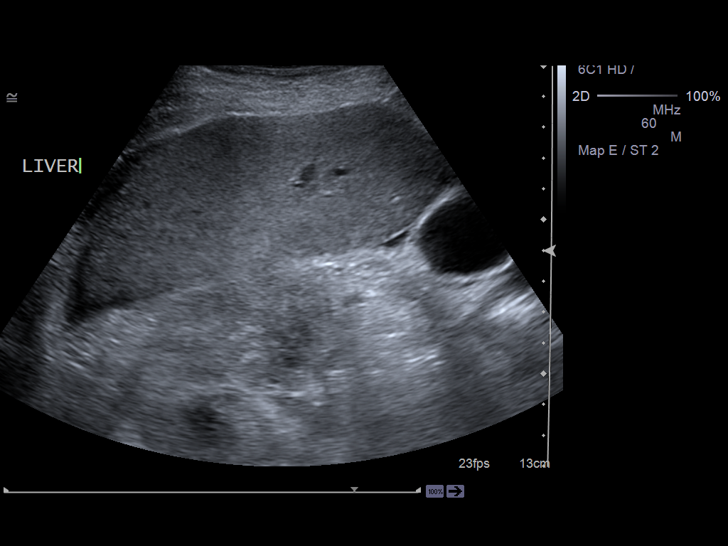
[im 67/67]
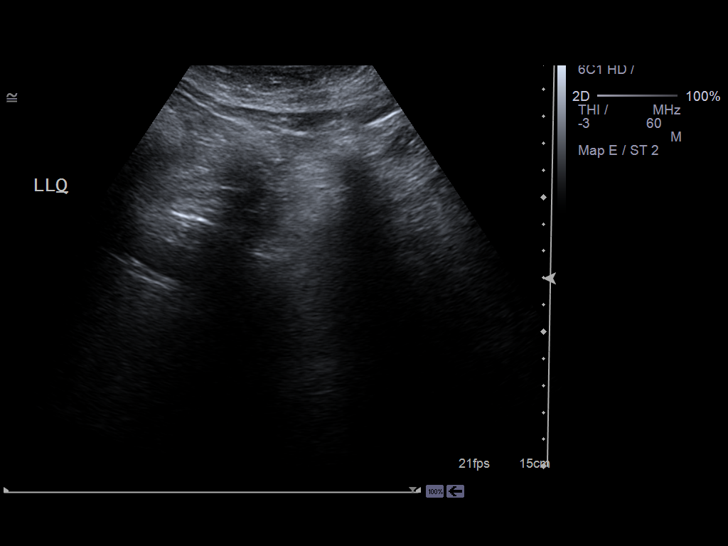

[14 of 25 positions shown; findings below may reference images not displayed]

PROCEDURE:     US  - US ABDOMEN GENERAL SURVEY  - May 15, 2012  [DATE]

RESULT:     The liver exhibits normal echotexture with no focal mass nor
ductal dilation. Portal venous flow is normal in direction toward the liver.
The gallbladder is adequately distended with no evidence of stones, wall
thickening, or pericholecystic fluid. There is no positive sonographic
Murphy's sign. The common bile duct is normal at 4.4 mm in diameter.

The pancreas was obscured by bowel gas. The spleen, abdominal aorta, and
inferior vena cava are normal in appearance. The kidneys exhibit no evidence
of hydronephrosis nor other acute abnormality.

There is a right pleural effusion. There is ascites.
IMPRESSION: 1. I do not see evidence of acute hepatobiliary abnormality. There are no
findings suspicious for hepatic cirrhosis or splenomegaly.
2. The pancreas could not be demonstrated due to the presence of bowel gas.
3. There is a right pleural effusion and there is ascites present.

[REDACTED]

## 2015-04-12 NOTE — Consult Note (Signed)
 ENT: MRI report and CT reviewed. To my eye there does not appear to be acute bone erosion on CT, just mastoid postsurgical change and MRI did not show definitive intracranial extension. Seizures have ceased today and Neurology feels it was likely Imipenem related. She now has Vanc and [place] which provides good CSF coverage. LP was declined though she was afebrile and it sounds like Neurology did not feel strongly that the LP should pushed given another explanation for the seizures. This has been a chronic cellulitis over months, waxing and waning. I saw the patient in March and the ear doe not look substantially different. I have talked with her surgeon, Dr. Lovey NewcomerKrause, and he agrees the ear problem must ultimately be handled at a tertiary care center. [facility]. She may end up needing a subtotal temporal bone resection with vascular flap coverage, which is beyond the expertise of me and my colleagues. The most I can currently recommend is medical management of the cellulitis, which is being done currently. If she is stabilized regarding seizures and afebrile it may be reasonable to discharge her for outpatient follow-up with ENT at a tertiary care center, though a transfer would be highly desireable at this point, given the limitations [facility].  Electronic Signatures: Sandi MealyBennett, Gerritt Galentine S (MD)  (Signed on 02-Aug-13 17:10)  Authored  Last Updated: 02-Aug-13 17:10 by Sandi MealyBennett, Masey Scheiber S (MD)

## 2015-04-12 NOTE — Consult Note (Signed)
Brief Consult Note: Diagnosis: seizures, mastoditis and cellulitis.   Patient was seen by consultant.   Consult note dictated.   Comments: - Pt had 2 seizures, now back to herself per daughters. Imipenem can lower seizure threshold but I am concerned about CNS infection from her chronic mastoiditis. - offered LP (better to be fluroscopic at her age) - will hold off on family's request (higly likely to be false negative due to being on Abx). - No meningeal signs - MRI brain with and without contrast - EEG - agree with fosphenytoin 20 mg /kg followed by 100 mg q 8 hr and following levels - Agree with starting levetiracetam (as long term Anti Epileptic Drug) - Discussed with DR. Sparks, nurse and family-  will sign off to Dr. Katrinka BlazingSmith.  Electronic Signatures: Jolene ProvostShah, Hemang Kalpeshkumar (MD)  (Signed 01-Aug-13 21:16)  Authored: Brief Consult Note   Last Updated: 01-Aug-13 21:16 by Jolene ProvostShah, Hemang Kalpeshkumar (MD)

## 2015-04-12 NOTE — Discharge Summary (Signed)
PATIENT NAME:  Heidi [name] [name], Heidi [name] [name] MR#:  [id number] DATE OF BIRTH:  November 10, 1927  DATE OF ADMISSION:  07/24/2012 DATE OF DISCHARGE:  07/28/2012  For [name] detailed note, please take [name] look at the history and physical done on admission by Dr. Aram BeechamJeffrey Sparks.   DISCHARGE DIAGNOSES: 1. Acute encephalopathy likely neurologic in nature secondary to [name] seizure.  2. Seizure secondary to carbapenem and tramadol.  3. Chronic mastoiditis.  4. Hypertension.  5. Gastroesophageal reflux disease.  6. Urinary retention.  7. Hypothyroidism.   DIET: The patient is being discharged on [name] mechanical soft, low sodium, low fat diet.   ACTIVITY: As tolerated.   DISCHARGE FOLLOWUP: Followup with Dr. Lorie PhenixNancy Maloney in 1 to 2 weeks.  DISCHARGE MEDICATIONS:  1. Amlodipine 10 mg daily.  2. Atenolol 50 mg daily. 3. Spiriva 1 puff daily.  4. Simvastatin 40 mg 1/2 tab daily.  5. HCTZ/triamterene 25/37.5 mg 1 tab daily.  6. Flonase two sprays to each nostril daily.  7. Gabapentin 300 mg twice [name] day. 8. Synthroid 125 mcg daily.  9. Quinapril 20 mg twice [name] day. 10. Gentamicin 0.1% topical ointment to be applied twice [name] day. 11. Xanax 0.5 mg as needed.  12. Zofran 4 mg every 4 to 6 hours as needed. 13. Tylenol/hydrocodone 300/5 mg one tab every four hours as needed.  14. Ciprodex three drops to the left ear twice [name] day. 15. Tylenol as needed.  16. Vancomycin 750 mg daily. 17. Ceftazidime 1 gram every 8 hours. 18. Dilantin 300 mg at bedtime.   CONSULTANTS: 1. Cristopher PeruHemang Shah, MD - Neurology. 2. Mellody DrownMatthew Smith, MD - Neurology.  3. Orson AloeMichael Blocker, MD - Infectious Disease.   PERTINENT STUDIES: CT scan of the head done without contrast on admission showed no acute intracranial process, but severe irregular soft tissue swelling and thickening involving the left auricle and periauricular soft tissues extending into the extra auditory canal, severe sclerosis and destruction of the left mastoid sinus with fragmentation.  Appearance is concerning for cellulitis and mastoiditis.   MRI of the brain done without contrast showed extensive soft tissue mass/swelling of the left auricle, external auditory canal, middle ear and inner ear, and mastoids. Infectious or malignant etiologies.   Ultrasound of the kidneys showed echogenic kidneys with stable appearing cyst with no evidence of any hydronephrosis.   HOSPITAL COURSE: This is an 81 year old female with medical problems as mentioned above who presented to the hospital on 07/24/2012 secondary to encephalopathy due to new onset seizures.  1. Acute encephalopathy. This was likely thought to be neurologic in nature secondary to seizures. The patient was loaded with IV Dilantin. The patient underwent [name] neurologic evaluation and also EEG which showed occipital sharp waves consistent with [name] seizure focus. The patient therefore was maintained on IV fosphenytoin and also started on p.o. Dilantin. The patient has been maintained on p.o. Dilantin and has had no further evidence of seizures. The likely cause of her seizures was the fact that she was on imipenem which lowers seizure threshold. Her imipenem has since then been discontinued. She was on imipenem due to [name] chronic left ear infection and mastoiditis.  2. Left ear mastoiditis. This is apparently [name] chronic issue for the patient. She has been having battling this for quite [name] while. [facility] is [facility] with Dr. Laveda Abberouse, an ENT physician. She underwent [name] MRI and [name] CT which showed significant destruction of the mastoid area on the left side.  She was already on IV antibiotics including vancomycin and imipenem prior to coming in, although she was switched over from imipenem to [place] along with the continuation of her vancomycin. The patient was seen in consultation by Dr. Leavy Cella from infectious disease and also by Dr. Willeen Cass from ENT. As per Dr. Willeen Cass, the patient did not need acute surgical  intervention but likely needs to continue to followup with her ENT as an outpatient. Dr. Leavy Cella also had [name] discussion with Dr. Thresa Ross who is the infectious disease doctor that follows Heidi [name] [name] as an outpatient. He recommended continuing vancomycin and Elita Quick indefinitely for now until he evaluates the patient as an outpatient. As per Dr. Leavy Cella, we are unclear whether the patient has had culture or biopsy done of this area including even fungal cultures. This likely further needs to be followed and as per the family they are adamant and they want to take the patient for [name] second opinion and they will continue follow-up with [facility] or [facility] along with Dr. Laveda Abbe from ENT as an outpatient. Clinically the patient has significantly improved with these antibiotics, she is afebrile, her white cell count is stable, she complains of less pain in her left ear and her auricle and her ear looks less inflamed. She already has [name] PICC line and she will continue to get IV antibiotics at the skilled nursing facility.  3. Urinary retention. The patient developed this in the past 24 to 48 hours where shortly after voiding she would develop [name] significant amount of postvoid residual. This has never happened to her before. The exact etiology of this is currently unclear. I obtain [name] renal ultrasound which showed no evidence of any hydronephrosis. She currently is clinically asymptomatic. For now we will continue to check postvoid residual three times [name] day and if she happens to have [name] bladder scan that is greater than 350 mL we will continue to perform in and out catheterization. She likely would benefit from [name] urology evaluation as an outpatient.  4. Hypertension. The patient remained hemodynamically stable on her maintenance medications including her amlodipine, atenolol, and her hydrochlorothiazide/triamterene. She will resume that.  5. Hypothyroidism. The patient was maintained on Synthroid. She will resume that.  6. Pain from  her mastoiditis. The patient is currently being controlled with some Tylenol with hydrocodone along with tramadol and she will resume that upon discharge.             DISPOSITION: The patient is currently being discharged to [name] short-term rehab given her profound weakness. Family is in agreement with this plan. The patient is a DO NOT INTUBATE, DO NOT RESUSCITATE.   TIME SPENT: 45 minutes. ____________________________ Rolly Pancake. Cherlynn Kaiser, MD vjs:slb D: 07/28/2012 15:57:46 ET T: 07/28/2012 16:45:49 ET JOB#: [id number]  cc: Rolly Pancake. Cherlynn Kaiser, MD, <Dictator> Leo Grosser, MD Houston Siren MD ELECTRONICALLY SIGNED 07/29/2012 8:01

## 2015-04-12 NOTE — Consult Note (Signed)
PATIENT NAME:  Heidi [name], Heidi [name] MR#:  [id number] DATE OF BIRTH:  12/07/1927  DATE OF CONSULTATION:  07/25/2012  REFERRING PHYSICIAN:  Fredia Sorrow, MD CONSULTING PHYSICIAN:  Rosalyn Gess. Mauriana Dann, MD  REASON FOR CONSULTATION: Mastoiditis and recent seizures.   HISTORY OF PRESENT ILLNESS: The patient is an 81 year old white female with a past history significant for chronic obstructive pulmonary disease and chronic mastoiditis and newly diagnosed diabetes who was admitted on 07/24/2012 with new onset of seizures. The patient has had a fairly extensive problem with her left ear. She had a childhood surgery on her ear and was diagnosed in December of 2012 with mastoiditis. She underwent surgery again at that time and in January developed complications of infection. She has been on multiple rounds of antibiotics, both orally and IV. Most recently she was started on vancomycin and imipenem. On the day of admission, she had two witnessed seizures at home. She was brought to the hospital and admitted. She has had one additional seizure in the Emergency Room. She is currently on Neurontin and fosphenytoin. She has had no further seizures. She went down for a MRI today and required sedation and she is minimally responsive and unable to provide any history. The history is obtained from her family.   ALLERGIES: Penicillin and sulfa.   PAST MEDICAL HISTORY:  1. Chronic obstructive pulmonary disease.  2. New onset diabetes.  3. 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  4. Osteoarthritis.  5. Gastroesophageal reflux disease.  6. Hypertension.  7. Anxiety.   SOCIAL HISTORY: She lives with family. She is an occasional smoker having been a heavy smoker in the past. No history of alcohol intake.   FAMILY HISTORY: Positive for hypertension.  REVIEW OF SYSTEMS: Unable to obtain from the patient due to her current neurologic status.   PHYSICAL EXAMINATION:    VITAL SIGNS: T-max 98.6, T-current 97.6, pulse 70, blood pressure 141/65, and 91% on room air.   GENERAL: An 81 year old white female in no acute distress but extremely lethargic.  HEENT: Normocephalic atraumatic. Pupils equal and reactive to light. Extraocular motion was unable to be assessed. Sclerae, conjunctivae, and lids are without evidence for emboli or hemorrhage. Oropharynx is difficult to examine due to patient compliance. Lips appear to be within normal limits. The left ear was swollen and erythematous. The ear canal was enlarged, what appear to be postsurgically. The area was red, swollen, and warm. It was tender to touch and the patient would groan and move her head with palpation of the area. There was no fluctuance, but there were some hardened areas behind the ear. There was no obvious drainage from the ear.   NECK: Midline trachea. No lymphadenopathy. No thyromegaly.   LUNGS: Clear to auscultation bilaterally. Good air movement. No focal consolidation.   HEART: Regular rate and rhythm without murmur, rub, or gallop.   ABDOMEN: Soft, nontender, and nondistended. No hepatosplenomegaly. No hernia is noted.   EXTREMITIES: No evidence for tenosynovitis.   SKIN: No rashes. No stigmata of endocarditis, specifically no Janeway lesions or Osler nodes.   NEUROLOGIC: The patient is minimally responsive only to pain with palpation of her ear. She is unable to follow any commands at this time.   PSYCHIATRIC: Unable to assess.   LABORATORY, DIAGNOSTIC AND RADIOLOGIC DATA: BUN 16, creatinine 1.15, bicarbonate 28, and anion gap 9. LFTs are unremarkable. White count is 12.0 with a hemoglobin of 10.1, platelet count of 420, and ANC of 9.1.  A sedimentation rate from admission was 85. A white count from admission was 14.7.   Blood cultures from admission showed no growth to date.   A CT scan of the head without contrast showed no acute intracranial process.   A MRI of the brain without  contrast showed extensive soft tissue mass and swelling in the left oracle, external auditory canal, middle ear, and inner mastoids.   IMPRESSION: An 81 year old white female with a past history significant for chronic obstructive pulmonary disease and chronic mastoiditis on IV antibiotics admitted with seizures.   RECOMMENDATIONS:  1. I spoke with Dr. Fredricka Bonineonnor her ID physician. Prior attempts her cultures have been negative. She was empirically started on carbapenem and vancomycin. She is admitted with new onset seizures.  2. While the inflammatory process in the mastoid could be extending it is more likely that the carbapenem has lowered her seizure threshold. The MRI showed inflammation but no obvious extension into the cranial cavity. We will review the film with radiology. If there is evidence for extension in the cranial cavity, she may need neurosurgical evaluation.  3. We will stop the carbapenem and start ceftazidime. This will have somewhat similar coverage but without the increased risk for seizures. We will continue the vancomycin for resistant gram-positive cocci coverage.  4. I do not have access to her prior work-up. Other possibilities to consider would be atypical mycobacterial infection, nocardiosis or relapsing polychondritis. I am not sure if AFB cultures have been done in the past. Nocardia also need special media. She does not currently have any drainage to send for culture. With relapsing polychondritis, the disease is limited to cartilage and does not affect bone. It appears that she has had mastoiditis in the past and this would not be consistent with this diagnosis.   This is a moderately complex infectious disease case. Thank you very much for involving me in Ms. Gressman's care. ____________________________ Rosalyn GessMichael E. Alexxus Sobh, MD meb:slb D: 07/25/2012 15:10:16 ET     T: 07/25/2012 15:21:02 ET        JOB#: [id number] Rosiland Sen E Bettey Muraoka MD ELECTRONICALLY SIGNED 07/29/2012 12:30

## 2015-04-12 NOTE — Consult Note (Signed)
PATIENT NAME:  Heidi [name], Heidi [name] MR#:  [id number] DATE OF BIRTH:  01-07-1927  DATE OF CONSULTATION:  07/24/2012  REFERRING PHYSICIAN:  Aram Beecham, MD  CONSULTING PHYSICIAN:  Lakeia Bradshaw K. Sherryll Burger, MD  REASON FOR CONSULTATION: Seizure.   HISTORY OF PRESENT ILLNESS: Heidi [name] is an 81 year old Caucasian female who was in her usual state of health this morning when she woke up a little bit late and around 12 o'clock her daughter noticed that she was having a seizure. Her eyes were rolled up and her whole body was stiff. It lasted for around five minutes. She personally had witnessed the seizure. She could not tell me if she had eye deviation or gaze deviation/head deviation.   After the seizure she was very sleepy. She was breathing heavy but she was able to stand up and go to the restroom with the help of the family without having any focal weakness or numbness.   The patient was brought to the ER and she had another seizure lasting around four minutes or so per family.   She also did not have any focal weakness or numbness after the second seizure. The second seizure semiology was same as the first one.   The patient is not having any fever or rash.   The patient has a longstanding history of mastoiditis.   She actually had her first surgery around age of 22, but most recently she had a mastoidectomy on 11/27/2011.   After that she has been battling with recurrent infection, cellulitis, and has been on multiple different antibiotics including cefepime, meropenem, and vancomycin. Ertapenem was recently switched over to imipenem.   Recently her vancomycin trough level was higher so she was advised to hold the vancomycin for today and give a lower dose tomorrow.   The patient does not have any recent head trauma or falls.   Family has not noticed any episodes of unexplained fluctuating mental status or staring spell.   Family declined episodes of unexplained bruising or waking her up in unusual  position or places.   The patient has a daughter who lives with her 24/7.   PAST MEDICAL HISTORY:  1. Malignant otitis media and mastoiditis. 2. Persistent/chronic cellulitis.  3. History of skin cancer.   4. Osteoarthritis.  5. Gastroesophageal reflux disease.  6. Type II diabetes mellitus.  7. Benign hypertension.  8. Anxiety.  9. Chronic obstructive pulmonary disease. 10. History of acute renal failure due to dehydration.   MEDICATIONS: I reviewed her current medication list.   PAST SURGICAL HISTORY: Mastoidectomy.   ALLERGIES: She is allergic to penicillin and sulfa.   SOCIAL HISTORY: Significant that patient used to smoke occasionally. She does not use alcohol. She is living with her daughter.   FAMILY HISTORY: Significant for hypertension. There is no history of epilepsy.   REVIEW OF SYSTEMS: Difficult to obtain but she seemed to have pain in her left ear. She is deaf in the left ear. She has cellulitis and itching in the left ear. Positive for seizures. Decreased hearing in the right ear. Other 10 system review of system was asked but I am not sure about the reliability of the findings.   PHYSICAL EXAMINATION:   VITAL SIGNS: Temperature 98.1, pulse 86, respiratory rate 18, blood pressure 184/76, pulse oximetry 92 on 2 liters of oxygen.   GENERAL: She is an elderly looking Caucasian female lying in bed surrounded by three family members.   She had cellulitis of her left ear in the periauricular  region. She also is status post mastoidectomy.   She has a PICC line in her right arm. She has multiple lacerations, small, on her body.   The patient doesn't have neck stiffness.   The patient drifts to sleep but is easily awakened with verbal and physical stimuli.   LUNGS: Clear to auscultation.   HEART: S1, S2 heart sounds. Carotid exam did not reveal any bruit.   Funduscopic exam was attempted but I was not able to perform due to her poor cooperation.   MENTAL STATUS:  She is alert. She was oriented to the time, place, and person. She recognized her daughter's name. She was able to give me her address. She knew the current President Obama. She was able to follow two-step inverted commands such as (tiger was killed by lion tell me who is dead, and point to the ceiling after pointing to the floor).   A more detailed cognitive exam was not performed but her attention and concentration seems to be reduced.   On her cranial nerves, her pupils were equal, round, and reactive. Extraocular movements seem to be intact. Her visual fields seem to be full but her cooperation was limited. Her face was symmetric. She has cellulitis changes in her left periauricular region.   She has decreased hearing on the right side, seems like she is trying to do lip reading.   Her face was symmetric. Tongue was midline. Facial sensation seems to be intact.   On her motor exam she has mild paratonia.   She does not have any pronator drift. She has very mild tremors. As well, she had some asterixis in her both upper extremities.   Her lower extremity strength seems to be 5 out of 5.   Sensations were intact to light touch. Her deep tendon reflexes were symmetric except she has some decreased ankle jerk.   Her toes were mute.   Her coordination seems to be intact with finger-to-nose. I did not make her walk.   ASSESSMENT AND PLAN: Seizure in a patient with recurrent mastoiditis status post mastoidectomy and cellulitis on multiple antibiotics.   Recently the patient was given imipenem for the last 2 to 3 weeks which can lower the seizure threshold.  I am worried about intracranial extension of her infection.   Her CT scan did not show any abscess or any connection but I would like to obtain MRI of the brain with and without contrast.   The ideal thing to do is to do a lumbar puncture.   With her age as well as her being on this many antibiotics recently for a long period of time  I think we might have a false negative result.   Fortunately, she does not have any signs of meningeal irritation at present with no neck stiffness. There is no photophobia. The patient does not have a significantly high leukocytosis, etc.   The patient has negative Kernig and Brudzinski sign.   I do not see any skin rash.   The patient is not grossly encephalopathic.   Even after I offered the patient and the family the lumbar puncture the ideal thing to do is to do it under fluoroscopic guidance.   They will think about it and prefer not to pursue it.   I will pursue the EEG.   There is a concern for subclinical seizures but the patient's family feels like she is back to herself.   I will continue IV fosphenytoin load followed by 100  mg IV fosphenytoin q.8 hours. Please check levels as well.    Correlate and correct serum albumin level.   The patient has been started on levetiracetam by Dr. Judithann SheenSparks. Plan is to continue that as long-term antiepileptic medication rather than Dilantin which seems to be appropriate.   Encephalopathy likely from her seizure postictal state but cannot rule out toxic metabolic encephalopathy concomitant as the patient has some asterixis as well.   Infectious encephalopathy is also possible.   The patient is already on antibiotic with vancomycin and imipenem.   The patient has been planned to have ID consult.   Left mastoiditis with cellulitis. Dr. Judithann SheenSparks has placed ENT consult. The family would like the patient to go to [facility] and have an opinion.   As they were in the process of getting a second opinion at [facility], she had a breakthrough seizures so she was brought to [facility] on an emergency basis.   I discussed the care with the family, all the team members, as well as Dr. Judithann SheenSparks and the patient's nurse.   Feel free to contact me with any further questions. I will inform Dr. Katrinka BlazingSmith about her admission.    ____________________________ Durene CalHemang K. Sherryll BurgerShah, MD hks:drc D: 07/24/2012 21:30:10 ET T: 07/25/2012 10:53:12 ET JOB#: [id number]  cc: Oshay Stranahan K. Sherryll BurgerShah, MD, <Dictator> Durene CalHEMANG K [facility] MD ELECTRONICALLY SIGNED 07/27/2012 18:10

## 2015-04-12 NOTE — Consult Note (Signed)
Impression: 81yo WF w/ h/o COPD and chronic mastoiditis on IV antibiotics admitted with seizures.  I spoke with Dr. Luciana Axeomer (ID).  Prior attempts at cultures have been negative.  She was empirically started on a carbapenem and vanco.  She was admitted with new onset seizures. While the inflammatory process in the mastoid could be extending, it is more likely that the carbapenem has lowered her seizure threshold.   MRI showed inflammation, but no obvious extension into the cranial cavity.is pending.  Will review the film with radiology.   If there is evidence for extension into the incranial cavity, she may need neurosurgical evaluation. Will stop the carbapenem.  Will start Ceftazidime.  This will have somewhat similar coverage.  Will continue the vanco for GPC coverage. I do not have access to her prior work up.  Other possibilities to consider would be atypical mycobacterial infection, Nocardiosis or relapsing polychondritis.  I am not sure if AFB cultures have been done in the past.  Nocardia also needs special media.  She does not currently have any drainage to send for culture.  With relapsing polychondritis, the disease is limited to the cartilage and does not affect bone.  It appears that she has had mastoiditis in the past which would not be consistent with this diagnosis.   Electronic Signatures: Liani Caris, Rosalyn GessMichael E (MD) (Signed on 02-Aug-13 15:01)  Authored   Last Updated: 02-Aug-13 15:10 by Francessca Friis, Rosalyn GessMichael E (MD)

## 2015-04-12 NOTE — Consult Note (Signed)
PATIENT NAME:  Heidi [name], SPONSEL MR#:  [id number] DATE OF BIRTH:  10/02/1927  DATE OF CONSULTATION:  07/24/2012  REFERRING PHYSICIAN:  Aram Beecham, MD CONSULTING PHYSICIAN:  Ollen Gross. Willeen Cass, MD  REASON FOR CONSULTATION: Left ear cellulitis.  HISTORY OF PRESENT ILLNESS: This is an 81 year old female with a history of chronic mastoiditis and diabetes who is a patient of Dr. Lovey Newcomer at [facility]. She underwent a left radical mastoidectomy for cholesteatoma in December 2012 along with a revision meatoplasty. Since then she has had difficulty healing from the surgery, felt to be in part due to her diabetes. She has had intermittent bouts of progression of the cellulitis including two separate admissions to this hospital. I was consulted in March for this issue. CT scan has not shown any evidence of abscess. Her current CT scan also shows extensive cellulitis and post radical mastoidectomy change without any evidence of abscess. Dr. Lovey Newcomer has been managing her with various antibiotics, including IV imipenem and vancomycin and previous other antibiotics during the course of this. They have been applying topical gentamicin ointment and Ciprodex drops as well. Apparently, previous attempt at culture have not been able to identify a specific organism. She has also been followed by an infectious disease specialist, Dr. Luciana Axe, in [place]. Unfortunately, they have not been able to make much progress with improving the cellulitis over the course of the past several months. Most recently they discussed referral to [facility]. It is Dr. Jodi Marble opinion, per the family, that the patient needs to be at a [facility] for management of this issue. This morning, however, she had a new onset seizure and was brought here emergently. She has not had any high fevers, although she did see Dr. Lovey Newcomer yesterday and had some modest debridement of the ear performed. The ear seemed a little more swollen and tender today.    PAST MEDICAL HISTORY:  1. History of skin cancer.  2. Osteoarthritis.  3. Gastroesophageal reflux. 4. Type 2 diabetes. 5. Benign hypertension.  6. Anxiety. 7. Chronic obstructive pulmonary disease.  8. History of acute renal failure due to dehydration.  MEDICATIONS:  1. Vancomycin 750 mg IV daily. 2. Imipenem 500 mg IV q.12 hours.  3. Norco 1 p.o. every 4 to 6 hours as needed for pain. 4. Norvasc 10 mg p.o. daily.  5. Atenolol 50 mg p.o. daily.  6. Flonase 2 puffs each nostril daily.  7. Neurontin 300 mg p.o. t.i.d.  8. Dyazide 1 p.o. daily.  9. Synthroid 0.125 mg p.o. daily.  10. Quinapril 20 mg p.o. b.i.d.  11. Zocor 20 mg p.o. daily.  12. Spiriva 1 capsule inhaled daily.  13. Ultram 50 mg p.o. every 12 hours p.r.n. pain. 14. Xanax 0.5 mg p.o. every six hours p.r.n. nervousness.  15. Zofran 4 mg p.o. every 4 to 6 hours p.r.n. nausea and vomiting.   ALLERGIES: Penicillin and sulfa.   SOCIAL HISTORY: The patient smokes occasionally. She has no history of alcohol abuse.   FAMILY HISTORY: Positive for hypertension. Negative for seizures, colon cancer or breast cancer.  REVIEW OF SYSTEMS: Unable to obtain. I asked her if she had had any stiff neck, nausea and vomiting. Apparently, she did have some nausea earlier today, but only neck stiffness and headache she has had has been presumed relative to the left ear infection.   PHYSICAL EXAMINATION:  VITAL SIGNS: Temperature 98.5, pulse 80, respirations 26, blood pressure 139/69.   GENERAL: Thin elderly female appearing a little groggy but able  to answer questions to some degree.   HEAD AND FACE: Head is normocephalic, atraumatic.   EARS: External ear on the right is unremarkable with normal tympanic membrane and ear canal. The left ear reveals diffuse cellulitis involving the auricle ear canal in periauricular areas, both preauricular and postauricular. There is no palpable abscess. The area is just firm with erythema. There is  no purulence seen that could be cultured. She has had a wide meatoplasty on the left and there is some exposed cartilage. The area is exquisitely tender.   NOSE: External nose unremarkable. Nasal cavity is clear. No purulence or polyps are seen.   ORAL CAVITY AND OROPHARYNX: Teeth, lips, and gums unremarkable. Tongue and floor of mouth without lesions. Posterior pharynx is clear.   NECK: Neck is supple without adenopathy or mass. There is no thyromegaly. Salivary glands are soft and nontender without masses.   NEUROLOGIC EXAM: Cranial nerves II through XII are grossly intact. Specifically, there is no evidence of any facial nerve paralysis.   DATA REVIEW: I did review her CT scan. I did not see any evidence of abscess. She has postsurgical changes from prior radical mastoidectomy, but I do not see any obvious signs of acute bone destruction. White count is elevated at 14.7.   ASSESSMENT AND RECOMMENDATIONS: This patient has chronic periauricular cellulitis after previous radical mastoidectomy with wide meatoplasty for cholesteatoma. A lot of this, having discussed this previously with Dr. Lovey NewcomerKrause, has been a wound healing issue from the meatoplasty with exposed cartilage and chronic infection that they have had trouble clearing. There is nothing to do acutely from a surgical perspective for this patient although I thoroughly agree with Dr. Lovey NewcomerKrause at this point that referral to a tertiary care center such as [facility] would be quite appropriate for this chronic problem. Whether they would consider aggressive debridement with vascular flap reconstruction at some point would be one of the key questions. As a general ear, nose and throat physician, my ability to contribute to this patient's care in a meaningful way beyond following Dr. Jodi MarbleKrause's recommendations, is low. He is a board-certified Fellowship trained otologist trained at the [facility], and at this point he has recommended the patient be seen in  [place]. Apparently, he is then initiating some initial steps of referral. The key question here would be the cause of her seizures and what sort of work-up she needs at this point for the seizures themselves. She has not had any high fever although she does have an elevated white count. Obviously, questions to whether she might need a lumbar puncture would be addressed. Neurology has been consulted to determine that. My only recommendation would be broad-spectrum antibiotic coverage. The most likely organisms would be staphylococcal organisms, which have been well covered with vancomycin, as well as adequate coverage for Pseudomonas. I would leave this to the infectious disease specialist's recommendations, but apparently there are some Pseudomonas species that are resistant to imipenem, so whether this might explain worsening is uncertain. Unfortunately, there is really nothing to culture. The ear has no purulent discharge and has been regularly treated topically with gentamicin and Ciprodex, so getting a culture that is useful is quite unlikely from the tissues in the absence of gross purulence. Previous cultures have not been helpful in this patient according to the family.   ____________________________ Ollen GrossPaul S. Willeen CassBennett, MD psb:ap D: 07/24/2012 18:46:46 ET T: 07/25/2012 10:38:18 ET JOB#: [id number]  cc: Ollen GrossPaul S. Willeen CassBennett, MD, <Dictator> Sandi MealyPAUL S Atreyu Mak MD  ELECTRONICALLY SIGNED 07/29/2012 11:28

## 2015-04-12 NOTE — H&P (Signed)
PATIENT NAME:  Heidi [name], Heidi [name] MR#:  [id number] DATE OF BIRTH:  January 15, 1927  DATE OF ADMISSION:  07/24/2012  REFERRING PHYSICIAN: Dr. Olivia MackieGina Martin   FAMILY PHYSICIAN: Dr. Lorie PhenixNancy Maloney   REASON FOR ADMISSION: New onset seizures with persistent cellulitis/mastoiditis.   HISTORY OF PRESENT ILLNESS: The patient is an 81 year old female with [name] significant history of type II diabetes and hypertension who underwent mastoid surgery in December of 2012. Since then, she has had persistent mastoiditis and malignant otitis media with cellulitis. She has been followed closely by ENT and Infectious Disease at [facility]. She is currently receiving IV vancomycin and imipenem via PICC line at home. She presents to our Emergency Room today after having two seizures at home which is new. No previous seizure history. She is postictal and lethargic in the Emergency Room. While in the Emergency Room, the patient had [name] small transient seizure as well. She is now admitted for further evaluation.   PAST MEDICAL HISTORY:  1. Malignant otitis media with mastoiditis.  2. Persistent/chronic cellulitis.  3. History of skin cancer.  4. Osteoarthritis.  5. Gastroesophageal reflux disease.  6. Type II diabetes.  7. Benign hypertension.  8. Anxiety.  9. Chronic obstructive pulmonary disease.  10. History of acute renal failure due to dehydration.   MEDICATIONS:  1. Vancomycin 750 mg IV daily.  2. Imipenem 500 mg IV q.12 hours.  3. Norco 1 p.o. q.4 to 6 hours p.r.n. pain.  4. Norvasc 10 mg p.o. daily.  5. Atenolol 50 mg p.o. daily.  6. Flonase 2 puffs each nostril daily.  7. Neurontin 300 mg p.o. t.i.d.  8. Dyazide 1 p.o. daily.  9. Synthroid 0.125 mg p.o. daily.  10. Quinapril 20 mg p.o. b.i.d.  11. Zocor 20 mg p.o. daily.  12. Spiriva 1 capsule inhaled daily.  13. Ultram 50 mg p.o. q.12 hours p.r.n. pain.  14. Xanax 0.5 mg p.o. q.6 hours p.r.n. nervousness.  15. Zofran 4 mg p.o. q.4 to 6 hours p.r.n.  nausea/vomiting.   ALLERGIES: Penicillin and sulfa.   SOCIAL HISTORY: The patient smokes occasionally. No history of alcohol abuse.   FAMILY HISTORY: Positive for hypertension but otherwise unremarkable, specifically negative for seizures, colon cancer, or breast cancer.   REVIEW OF SYSTEMS: Unable to obtain from the patient due to her lethargic mental state.   PHYSICAL EXAMINATION:   GENERAL: The patient is chronically ill appearing, lethargic, but in no acute distress.   VITAL SIGNS: Vital signs are currently remarkable for [name] blood pressure of 139/69 with [name] heart rate of 80 and [name] respiratory rate of 26. She is afebrile.   HEENT: Head atraumatic, normocephalic. Pupils equally round and reactive to light and accommodation. Extraocular movements are intact. Sclerae are nonicteric. Conjunctivae are clear. Oropharynx is dry but clear. The left side of the face is severely tender and erythematous to touch.   NECK: Supple without JVD or bruits. No adenopathy or thyromegaly is noted.   LUNGS: Clear to auscultation and percussion except for some faint basilar rhonchi. No wheezes or rales. No dullness.   CARDIAC: Regular rate and rhythm with normal S1, S2. No significant rubs, murmurs, or gallops. PMI is nondisplaced. Chest wall is nontender.   ABDOMEN: Soft, nontender with normoactive bowel sounds. No organomegaly or mass were appreciated. No hernias or bruits were noted.   EXTREMITIES: Without clubbing, cyanosis, or edema. Pulses were 2+ bilaterally.   SKIN: Warm and dry without lesions. Rash noted to the left face as per  HEENT exam.   NEUROLOGIC: Cranial nerves II through XII grossly intact. Deep tendon reflexes were symmetric. Motor and sensory exams nonfocal.   PSYCH: The patient was lethargic and unable to answer questions appropriately.   LABORATORY DATA: CBC revealed [name] white count of 14.7 with [name] hemoglobin of 11.3. Glucose was 176 with [name] BUN of 18, [name] creatinine of 1.45 with [name] sodium  of 139 and [name] potassium of 3.4. GFR was 33.   Head CT revealed severe irregular soft tissue swelling and thickening involving the left auricle and periauricular soft tissues extending into the external auditory canal.   ASSESSMENT:  1. New onset seizures.  2. Persistent/chronic mastoiditis/cellulitis.  3. Altered mental status secondary to #1.  4. Type II diabetes mellitus. 5. Anemia of chronic disease.  6. Hypokalemia.  7. Chronic obstructive pulmonary disease.  8. Anxiety.   PLAN:  1. The patient will be admitted to the floor, started on IV antibiotics after blood cultures have been sent.  2. Will load with Cerebyx in the Emergency Room and begin p.o. Keppra.  3. Will consult Neurology in regards to her seizures.  4. Will consult. ENT and Infectious Disease in regards to her mastoiditis and cellulitis.  5. Will follow her sugars with Accu-Cheks q.[name].c. and at bedtime and add sliding scale insulin as needed.  6. Will continue her Synthroid and check her TSH.  7. Will continue her blood pressure medications and follow this closely.  8. Will supplement oxygen at this time empirically and wean as tolerated.  9. Will obtain PICC line evaluation to make sure that her PICC line in place is working adequately.  10. Further treatment and evaluation will depend upon the patient's progress.   TOTAL TIME SPENT ON THIS PATIENT: 50 minutes.   ____________________________ Duane Lope Heidi Sheen, MD jds:drc D: 07/24/2012 17:21:07 ET T: 07/24/2012 17:43:12 ET JOB#: [id number]  cc: Duane Lope. Heidi Sheen, MD, <Dictator> Leo Grosser, MD Stryker Veasey Rodena Medin MD ELECTRONICALLY SIGNED 07/24/2012 18:13

## 2015-04-17 NOTE — Consult Note (Signed)
PATIENT NAME:  TANEQUA, KRETZ MR#:  [id number] DATE OF BIRTH:  10-17-1927  DATE OF CONSULTATION:  03/13/2012  REFERRING PHYSICIAN:  Hilda Lias, MD CONSULTING PHYSICIAN:  Ollen Gross. Willeen Cass, MD  REASON FOR CONSULTATION: Cellulitis left ear.   HISTORY OF PRESENT ILLNESS: 81 year old female with history of diabetes who is a patient of Dr. Lovey Newcomer at [facility]. He has been treating her for left ear cellulitis and recommended she be put in the hospital for IV antibiotics. The family chose to bring the patient to [facility] since they live here. 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 Lovey Newcomer in December 2012 along with a revision meatoplasty. Apparently, she has had difficulty healing from this surgery, felt to be secondary to her diabetes, but has seemed to worsen over the past several days, developing rapidly progressive cellulitis of the left external ear and postauricular area. CT scan last night showed no evidence of abscess.   PAST MEDICAL HISTORY:  1. Diabetes mellitus. 2. Hypertension. 3. Hypothyroidism. 4. Hyperlipidemia.  5. Apparently, she has some component of asthma and she is on Spiriva.  6. Allergic rhinitis.  7. Reflux.  8. Osteoarthritis.  9. History of headaches.   PAST SURGICAL HISTORY: As above. She has also had basal cell carcinoma removed from the nose reflux.   ALLERGIES: Penicillin and sulfa drugs.   MEDICATIONS:  1. She had been on Cipro and Ciprodex prior to admission. Currently, they have her on vancomycin and aztreonam.  2. Norvasc 10 mg p.o. daily.  3. Atenolol 50 mg p.o. daily.  4. Tylenol 650 mg p.o. every four hours p.r.n. pain or temperature.  5. Zyrtec 10 mg p.o. daily. 6. Neurontin 300 mg p.o. b.i.d.  7. Hydrochlorothiazide 12.5 mg p.o. daily.  8. Sliding scale insulin. 9. Synthroid  0.112 mg p.o. daily.  10. Zofran 4 mg IV q.4 hours p.r.n. nausea and vomiting. 11. Zocor 20 mg p.o. daily.  12. Spiriva 1 capsule inhaled daily.   SOCIAL HISTORY: Her daughter is here with her in the hospital and they live locally. She does smoke. She has a long history of this, but no alcohol abuse.   FAMILY HISTORY: Father and mother died of complications of heart disease.   REVIEW OF SYSTEMS: She has not had any significant fever, weight gain or weight loss. She has not had any blurred vision. She has a history of tinnitus. She has not had any sore throat, cough, hemoptysis, dyspnea, chest pain, syncope, or vertigo.   PHYSICAL EXAMINATION:  VITAL SIGNS: Temperature 98.8, pulse 88, blood pressure is 160/66.   GENERAL: Well-developed, well-nourished elderly female, obviously in discomfort.  FACE: Head is normocephalic, atraumatic. There are no facial skin lesions other than the erythema in the preauricular area on the left side. Facial strength is normal and symmetric.   EAR: Right external ear, ear canal, tympanic membrane clear. Left ear is diffusely erythematous over the auricle and extending out in the preauricular area and postauricular area with some tenderness below the ear, possibly representing small inflamed lymph node. There is no evidence of abscess. She has had a large meatoplasty and there is a mass of nonepithelialized soft tissue anteriorly and superiorly about a centimeter and half at least in size which apparently represents some granulation tissue that Dr. Lovey Newcomer has been following. Apparently, this has drained previously, but that  had settled down. The mastoid itself has a small amount of debris in it, but is certainly not impacted. There is a little bit of cerumen over the reconstructed tympanic membrane region so I cannot see that.   NOSE: External nose unremarkable. Nasal cavity is clear. No purulence or polyps are seen.   ORAL CAVITY AND OROPHARYNX: Teeth, lips, and  gums unremarkable. Tongue and floor of mouth without lesions. Posterior pharynx is clear. There is no erythema or exudate.   NECK: Neck is supple. There is some evidence of some small tender nodes just below the ear on the left side, but no other adenopathy is palpable. There is no thyromegaly. Salivary glands are soft and nontender.   NEUROLOGIC: Cranial nerves II through XII are grossly intact.   DATA REVIEW: I did review her CT scan personally and there is no evidence of abscess or cellulitis in the periauricular area. The soft tissue mass that was commented on by the radiologist would correspond with the noted granulation tissue.   ASSESSMENT: This patient has cellulitis of the left external ear. He has had extensive surgery by Dr. Lovey [place]Krause in [place]. There are no immediate surgical needs now. Agree with coverage for staph and Pseudomonas which is being provided. I would recommend continuing Ciprodex down into the ear as well as application of gentamicin ointment to the external ear in the region of the granulation tissue that was noted. I will contact Dr. Lovey NewcomerKrause just to let him know I have seen the patient, but once this cellulitis is settled down, she will need to return to Dr. Lovey NewcomerKrause who has been caring for her ear and has previously operated on it. He will have the best opportunity to continue the care of this patient given his familiarity with her surgical history. I will talk to Dr. Lovey NewcomerKrause to see if he has considered biopsying the granulation tissue just to make sure this is not a cancer on the anterior canal, but I would prefer not to stir up more inflammation and potential worsening of infection right now by performing biopsies. Clearly the main thing here would be getting the cellulitis under control. I will see the patient tomorrow, but I will be out of town for the weekend. If any further ENT input is desired over the weekend, the on-call physician can be notified.    ____________________________ Ollen GrossPaul S. Willeen CassBennett, MD psb:ap D: 03/13/2012 08:32:58 ET T: 03/13/2012 08:56:11 ET JOB#: [id number] cc: Ollen GrossPaul S. Willeen CassBennett, MD, <Dictator> Sandi MealyPAUL S Malachy Coleman MD ELECTRONICALLY SIGNED 03/14/2012 7:40

## 2015-04-17 NOTE — Discharge Summary (Signed)
PATIENT NAME:  Heidi [name], Heidi [name] MR#:  [id number] DATE OF BIRTH:  November 12, 1927  DATE OF ADMISSION:  03/12/2012 DATE OF DISCHARGE:  03/18/2012  PRIMARY CARE PHYSICIAN: Leo Grosser, MD   PRIMARY ENT PHYSICIAN: Dr. Dorma Russell in [facility]  REASON FOR ADMISSION: Left ear pain, redness and swelling.   DISCHARGE DIAGNOSES:  1. Left ear post and periauricular cellulitis.  2. History of diabetes.  3. History of hypertension.  4. History of hyperlipidemia.  5. History of diabetic neuropathy.  6. History of hypothyroidism.  7. History of radical left-sided mastoidectomy with revision 11/2011.  8. History of chronic ear infections, status post radical mastoidectomy.   CONSULTS: ENT, Dr. Willeen Cass.   DISCHARGE DISPOSITION: Home.   DISCHARGE MEDICATIONS:  1. Cipro 500 mg p.o. every 12 hours x5 days.  2. Doxycycline 100 mg p.o. every 12 hours x5 days.  3. Ciprodex otic suspension 4 drops to left ear every 12 hours x5 days.  4. Gentamicin 0.1% ointment apply to left ear every 8 hours x5 days (apply to opening of left ear canal anteriorly where skin is raw.)  5. Percocet 10/325, 1 tab p.o. every 8 hours p.r.n. pain.  6. Albuterol metered dose inhaler 1 to 2 puffs inhaled every 4 to 6 hours p.r.n.  7. Senna S 1 tablet p.o. b.i.d. p.r.n. constipation. 8. Multivitamin tablet 1 p.o. daily. 9. Tylenol 325 mg, 1 to 2 tablets p.o. every 8 hours p.r.n. pain.  10. Zofran ODT 4 mg p.o. every 6 hours p.r.n. nausea or vomiting.  11. Amlodipine 10 mg a day.  12. Atenolol 50 mg a day.  13. Flonase 50 mcg inhaler 1 spray intranasally daily.  14. Gabapentin 300 mg p.o. b.i.d.  15. HCTZ 12.5 mg p.o. daily.  16. Metformin Extended Release 500 mg p.o. t.i.d.  17. Quinapril 20 mg p.o. b.i.d.  18. Simvastatin 20 mg p.o. daily.  19. Spiriva HandiHaler 1 cap inhaled daily.  20. Synthroid 112 mcg daily.  21. Trilipix 135 mg p.o. daily.   DISCHARGE CONDITION: Improved, stable.   DISCHARGE ACTIVITY: As tolerated.    DISCHARGE DIET: Low sodium, ADA, low fat, low cholesterol.   DISCHARGE INSTRUCTIONS:  1. Take medications as prescribed.  2. Return to the Emergency Department  for fevers or chills or worsening left ear pain, swelling or redness, or for recurrence of admitting symptoms.   FOLLOWUP INSTRUCTIONS:  1. Follow up with Dr. Elease Hashimoto in 1 to 2 weeks.  2. Follow up with Dr. Dorma Russell, [facility], within 1 week.   LABORATORY, DIAGNOSTIC AND RADIOLOGICAL DATA:  Maxillofacial CT 03/12/2012: Extensive soft tissue swelling with possible mass lesion about the left external auditory canal, cervical lymphadenopathy. No abscess. Postsurgical changes noted about the left mastoid consistent with a prior mastoidectomy. No acute bony lesions are noted.  CBC on admission: WBC 11.3, hemoglobin 13.9, hematocrit 41.3, platelets 448.  CBC normal from 03/13/2012.  Blood cultures x2 from 03/12/2012 no growth to date.   BRIEF HISTORY/HOSPITAL COURSE: The patient is a pleasant 81 year old female with past medical history of hypertension, diabetes mellitus, hyperlipidemia, diabetic neuropathy, hypothyroidism, chronic ear infections, status post radical left-sided mastoidectomy, status post revision in 11/2011, who presented to the Emergency Department secondary to left ear swelling, pain and redness. Please see dictated admission History and Physical for pertinent details surrounding the onset of this hospitalization. Please see below for further details.   1. Left ear post and periauricular cellulitis for which ENT consultation was obtained: Blood cultures were obtained, and the  patient was initially placed on broad-spectrum IV antibiotics, initially vancomycin and aztreonam, and responded well to IV antibiotics. She was also placed on pain control. ENT consultation was obtained, and Dr. Willeen CassBennett recommended continuation of IV antibiotics and monitoring the patient's clinical status closely. She was also placed on Ciprodex  otic and gentamicin ointment to be placed in the ear. ENT did not recommend any procedures at this time, but Dr. Willeen CassBennett felt that the patient may require future biopsy of the granulation tissue at the anterior meatus once infection has cleared but felt that there was no need to do so acutely as it was felt that it may make her condition worse. Once her condition was noted to have improved with IV antibiotics, she was transitioned to oral antibiotics, and ENT recommended transition to oral Ciprofloxacin and doxycycline in addition to continuation of Ciprodex Otic and gentamicin eardrops, and recommended that the patient follow up with her primary ENT physician, Dr. Dorma RussellKraus, upon discharge as soon as possible. With antibiotic therapy, the patient's overall clinical condition has improved with significant improvement of swelling, erythema and pain of the left ear. She would need to follow up closely with Dr. Dorma RussellKraus upon discharge. Maxillofacial CT was obtained with results as mentioned above, and soft tissue swelling was noted but a mass could not be excluded; therefore, she will need to follow up with her ENT physician, Dr. Dorma RussellKraus, closely as an outpatient and may require future biopsy, as mentioned above. Of note, blood culture did not reveal any growth to date. She had mild leukocytosis on admission which was felt to be related to her infection, and her WBC count normalized with antibiotics. She was also noted to have mild thrombocytosis, and this was felt to be in acute phase reactant, and platelet count has normalized with antibiotic therapy.  2. Diabetes: Initially metformin was held since she received IV contrast for her CT scan; and after 72 hours post contrast administration metformin was restarted, and her sugars have remained relatively well controlled 3. Hypertension: Blood pressure is well controlled on the patient's current home regimen, and she will continue HCTZ, atenolol, Norvasc and quinapril upon  hospital discharge.  4. Diabetic neuropathy: The patient continued Neurontin.  5. Hypothyroidism: The patient continued Synthroid. 6. Hyperlipidemia: The patient continued simvastatin.   On 03/18/2012, the patient was hemodynamically stable with significant improvement of her left ear pain, swelling and redness and was felt to be stable for discharge home with close outpatient follow-up, to which the patient was agreeable.   TIME SPENT ON DISCHARGE: Greater than 30 minutes.  ____________________________ Elon AlasKamran N. Virgilio Broadhead, MD knl:cbb D: 03/22/2012 22:12:49 ET T: 03/24/2012 12:49:50 ET JOB#: [id number]  cc: Elon AlasKamran N. Bralee Feldt, MD, <Dictator> Leo GrosserNancy J. Maloney, MD Dr. Dorma RussellKraus, [facility] [place] [facility]  Scotty CourtKAMRAN N Mccall Lomax MD ELECTRONICALLY SIGNED 04/02/2012 22:01

## 2015-04-17 NOTE — H&P (Signed)
PATIENT NAME:  DIAMONE, WHISTLER MR#:  [id number] DATE OF BIRTH:  01/06/1927  DATE OF ADMISSION:  03/12/2012  PRIMARY CARE PHYSICIAN: Dr. Lorie Phenix  CHIEF COMPLAINT: Left ear pain and redness and swelling.   HISTORY OF PRESENT ILLNESS: This is an 81 year old female who presents to the Emergency Room from her ENTs office, Dr. Tia Masker, for referral for possible left ear cellulitis and abscess formation. Patient at a very young age when she was around 9 had a radical left mastoidectomy done for recurrent inner ear infections. She also has been having a recurrent cholesteatoma forming in her left ear for which she had a revision of her radical mastoidectomy done in December of this past year. Since the surgery patient has been having a lot of significant ear pain. Patient's ear pain has been progressively getting worse therefore she has been following with her ENT. Today and in the past two days patient has been having some significant pain in that left ear along with more redness and swelling. She went to see her ENT who performed an exam but thought that she might benefit from inpatient admission and likely IV antibiotics as she was noted to have cellulitis around her external ear and in the postauricular area. Patient was sent over to the ER at [facility] for further evaluation given the fact that her family lives in [place] and her primary care physician, Dr. Elease Hashimoto, is located in this area. Upon further questioning of why the family did not take the patient to [facility] it was more due to location reasons. Hospitalist service was contacted for further treatment and evaluation.   REVIEW OF SYSTEMS: CONSTITUTIONAL: No documented fever. No weight gain. No weight loss. EYES: No blurry or double vision. ENT: Positive tinnitus. positive redness of the left ear. No redness of the oropharynx. No postnasal drip. RESPIRATORY: No cough, no wheeze, no hemoptysis, no dyspnea. CARDIOVASCULAR: No chest  pain, no orthopnea, no palpitations, no syncope. GASTROINTESTINAL: No nausea, no vomiting, no diarrhea, no abdominal pain, no melena, no hematochezia. GENITOURINARY: No dysuria, no hematuria. ENDOCRINE: No polyuria or nocturia. No heat or cold intolerance. HEME: No anemia, no bruising, no bleeding. INTEGUMENTARY: No rashes. No lesions. MUSCULOSKELETAL: No arthritis, no swelling, no gout. NEUROLOGIC: No numbness, no tingling, no ataxia, no seizure-type activity. PSYCH: No anxiety, no insomnia, no ADD.   PAST MEDICAL HISTORY:  1. Hypertension.  2. Diabetes.  3. Diabetic neuropathy. 4. Hyperlipidemia. 5. History of chronic inner ear infections, status post radical mastoidectomy.   ALLERGIES: Penicillin and sulfa drugs.   SOCIAL HISTORY: Does smoke, has a long-time smoking history and still occasionally smokes a cigarette here and there. No alcohol abuse. No illicit drug abuse. Lives at home with her daughter.   FAMILY HISTORY: Both mother and father died from complications of heart disease.   CURRENT MEDICATIONS:  1. Atenolol 50 mg daily.  2. Vicodin as needed for pain.  3. Gabapentin 300 mg b.i.d.  4. Hydrochlorothiazide 12.5 mg daily.  5. Metformin 500 mg daily. 6. Trilipix 135 mg daily.  7. Zyrtec 10 mg daily.  8. Amlodipine 10 mg daily.  9. Atenolol 50 mg daily.  10. Quinapril 20 mg daily.  11. Simvastatin 20 mg daily.  12. Spiriva 1 puff daily.  13. Synthroid 112 mcg daily.   PHYSICAL EXAMINATION ON ADMISSION:  VITAL SIGNS: Patient's vital signs are noted to be: Temperature 98.2, pulse 85, respirations 24, blood pressure 133/60, sats 95% on room air.   GENERAL: She is  a pleasant appearing female in no apparent distress.   HEENT: She is atraumatic, normocephalic. Her extraocular muscles are intact. Her pupils are equal, reactive to light. Her sclerae are anicteric. There is no conjunctival injection. No oropharyngeal erythema. Her left ear is fairly tender to touch. She has  significant redness and swelling around her entire ear and also in the posterior auricular area.   NECK: Supple. There is no jugular venous distention. No bruits. No lymphadenopathy. No thyromegaly.   HEART: Regular rate and rhythm. No murmurs, no rubs, no clicks.   LUNGS: Clear to auscultation bilaterally. No rales, no rhonchi, no wheezes.   ABDOMEN: Soft, flat, nontender, nondistended. Has good bowel sounds. No hepatosplenomegaly appreciated.   EXTREMITIES: There is no evidence of any cyanosis, clubbing, or peripheral edema. Has +2 pedal and radial pulses bilaterally.   NEUROLOGICAL: Patient is alert, awake, oriented x3 with no focal motor or sensory deficits appreciated bilaterally.   SKIN: Moist, warm with no rash appreciated.   LYMPHATIC: There is no cervical or axillary lymphadenopathy.   LABORATORY, DIAGNOSTIC, RADIOLOGICAL DATA: Serum glucose 167, BUN 25, creatinine 0.9, sodium 135, potassium 3.5, chloride 96, bicarbonate 26. Liver function tests are within normal limits. White cell count 11.3, hemoglobin 13.9, hematocrit 41.3, platelet count 448.   Patient did have a CT scan of the maxillofacial area done with contrast which shows extensive soft tissue swelling/possible mass lesion about the left external auditory canal. Cervical lymphadenopathy, no abscess. Postsurgical changes are noted about the left mastoid consistent with a prior mastoidectomy. No acute bony lesions noted.   ASSESSMENT AND PLAN: This is an 81 year old female with past medical history of hypertension, hyperlipidemia, diabetes, diabetic neuropathy, chronic obstructive pulmonary disease, hypothyroidism that presents to the hospital with the left ear pain, redness and swelling.  1. Left ear cellulitis. Patient is status post recent left-sided revision mastoidectomy. Now developed cellulitis. She has been seen by an ENT, Dr. Tia Mask[place]Krouse, in [place] who recommended inpatient admission for IV antibiotics for treatment of  possible underlying superficial cellulitis. Patient has been started on broad-spectrum antibiotics including vancomycin and aztreonam as she is allergic to penicillin. She currently remains afebrile. She is hemodynamically stable. I will also go ahead and get an ENT consult here. I discussed the case with Dr. Willeen CassBennett who will see the patient tomorrow.  2. Diabetes. I will hold her metformin for now as she received contrast for her CAT scan. Will place her on insulin sliding scale.  3. Hypertension. Patient presently hemodynamically stable. Continue with atenolol, amlodipine, quinapril and hydrochlorothiazide.  4. Hypothyroidism. Continue with Synthroid.  5. Diabetic neuropathy. Continue with Neurontin.  6. Hyperlipidemia. Continue with simvastatin.  7. Chronic obstructive pulmonary disease. There is no evidence of any acute exacerbation. Continue her maintenance Spiriva for now.  8. CODE STATUS: Patient is a FULL CODE.   TIME SPENT WITH THE ADMISSION: 50 minutes.  ____________________________ Rolly PancakeVivek J. Cherlynn KaiserSainani, MD vjs:cms D: 03/12/2012 21:22:11 ET T: 03/13/2012 05:51:01 ET JOB#: [id number]  cc: Rolly PancakeVivek J. Cherlynn KaiserSainani, MD, <Dictator> Leo GrosserNancy J. Maloney, MD Houston SirenVIVEK J Carylon Tamburro MD ELECTRONICALLY SIGNED 03/14/2012 18:44

## 2015-04-17 NOTE — Consult Note (Signed)
Details:    - [facility] f/u Note Pt in pain this AM, but cellulitis appears to be responding to antibiotics. The erythema and edema in the pre and post auricular tissues has receeded some. Auricle is still red and swollen. Expect that to be the slowest to respond. No d/c seen from mastoid on exam. Facial nerve intact.   Would continue current management until cellulitis improves and see a response on the auricle. At that point pt can be d/c home on oral Cipro and Doxycycline to provide combined coverage for pseudomonas and staph. Continue Ciprodex and gentamicin ointment to ear.   I will sign off. No surgical intervention is needed, and I will be out of town this weekend. Can reconsult the on call MD if condition changes. This patient is to f/[facility] as an outpatient with [facility]. I have discussed her management with him and he agrees. I did suggest he consider a biopsy of the granulation tissue at the anterior meatus once the infection has cleared, but not indicated at this time as it may only worsen the current situation.  Marion DownerScott Danee Soller, MD   Electronic Signatures: Sandi MealyBennett, Rhanda Lemire S (MD)  (Signed [id number]-Mar-13 07:37)  Authored: Details   Last Updated: 22-Mar-13 07:37 by Sandi MealyBennett, Keywon Mestre S (MD)

## 2015-04-17 NOTE — H&P (Signed)
PATIENT NAME:  Heidi [name], Heidi [name] MR#:  [id number] DATE OF BIRTH:  16-Apr-1927  DATE OF ADMISSION:  05/12/2012  PRIMARY CARE PHYSICIAN: Heidi PhenixNancy Maloney, MD   CHIEF COMPLAINT: Nausea.   HISTORY OF PRESENT ILLNESS: The patient is an 81 year old female sent from her primary care physician's office with nausea. For the past four days, the patient has had nausea, vomiting, decreased p.o. appetite, unable to keep anything down. No diarrhea, abdominal pain, fevers, or chills. Of note, she was also having some facial swelling which occurs when she wakes up. In December she had mastoiditis and she is on triple antibiotics for this.   REVIEW OF SYSTEMS: CONSTITUTIONAL: No fever. Positive fatigue and weakness. EYES: No blurred or double vision. ENT: Positive hearing loss. No sinus pain. RESPIRATORY: No cough, wheezing, hemoptysis, dyspnea. CARDIOVASCULAR: No chest pain, palpitations, orthopnea, syncope, edema, arrhythmia, dyspnea on exertion. GI: Positive nausea and vomiting. No diarrhea. No abdominal pain. No hematemesis, melena, or ulcers. GU: No dysuria or hematuria. ENDOCRINE: No polyuria or polydipsia. HEME/LYMPH: No anemia or easy bruising. SKIN: No rash or lesions. NEUROLOGIC: No history of CVA, TIA, or seizures. PSYCH: No history of anxiety or depression.   PAST MEDICAL HISTORY:  1. Skin cancer.  2. Osteoarthritis.   3. Gastroesophageal reflux disease.  4. Diabetes.  5. Hypertension.   MEDICATIONS:  1. Atenolol 50 mg daily.  2. Ciprofloxacin 500 b.i.d. for her ear.  3. Doxycycline 100 mg b.i.d. for her ear.  4. Cefepime 2 grams b.i.d. for her ear.  5. Synthroid 125 mcg daily.  6. Triamterene/HCTZ 37.5/25 daily.  7. Norvasc 10 mg daily.  8. Neurontin 300 t.i.d.  9. Metformin 500 b.i.d.  10. Quinapril 20 mg daily.  11. Zyrtec 10 mg daily.  12. Spiriva 18 mcg daily.  13. Simvastatin 20 mg at bedtime.  14. Trilipix 135 mg daily.   ALLERGIES: Penicillin, sulfa, decongestants. No aspirin or  ibuprofen.   SOCIAL HISTORY: The patient smokes very occasionally, not interested at this time in quitting completely. No alcohol or IV drug use. Her daughter lives with her.   FAMILY HISTORY: Positive for hypertension.   PAST SURGICAL HISTORY:  1. Left ear mastoidectomy.  2. Tubal ligation.   PHYSICAL EXAMINATION:   VITAL SIGNS: Afebrile, temperature 97.3, pulse 66, respirations 20, blood pressure 181/77, 98% on room air.   GENERAL: The patient is alert, oriented, not in acute distress.   HEENT: Head is atraumatic. Pupils are round, reactive. Sclerae anicteric. Mucous membranes are dry. Oropharynx is clear.   NECK: Supple without JVD or carotid bruit.   CARDIOVASCULAR: Regular rate and rhythm. No murmurs, gallops, or rub. PMI is not displaced.   LUNGS: Clear to auscultation bilaterally without crackles, rales, rhonchi, or wheezing. Normal percussion.   ABDOMEN: Bowel sounds are positive. Nontender, nondistended. No hepatosplenomegaly.   EXTREMITIES: No clubbing, cyanosis, or edema.   NEUROLOGIC: Cranial nerves II through XII are grossly intact. No focal deficits.   SKIN: Without rashes or lesions.   LABORATORY, DIAGNOSTIC, AND RADIOLOGICAL DATA: Sodium 147, potassium 4.0, chloride 116, bicarb 21, BUN 37, creatinine 1.81, glucose 111, calcium 7.7, bilirubin 0.4, alkaline phosphatase 43, ALT 12, AST 24, total protein 6.5, albumin 3.7, white blood cells 7.2, hemoglobin 10.8, hematocrit 34, platelets 282. Troponin less than 0.02. CK 21. CPK-MB 0.7.   Abdominal x-ray which I have ordered is pending.   ASSESSMENT AND PLAN: This is an 81 year old female who presents with four days of nausea and vomiting.  1. Nausea and vomiting.  Suspect this could be [name] viral etiology for her nausea and vomiting. She has some mild renal insufficiency but not enough to be causing the symptoms. I will provide some IV fluids, supportive care, and monitor closely. I will also check an abdominal x-ray to  rule out any obstruction although her abdominal exam is completely benign.  2. Mastoidectomy in December. The patient will continue with her triple antibiotics.  3. Mild hypernatremia from dehydration. Will provide IV fluids. Repeat [name] BMP in the [name].m.  4. Acute renal failure secondary to dehydration, nausea, and vomiting. Will provide IV fluids. Hold any nephrotoxic agents. Repeat [name] BMP in the [name].m.  5. Hypertension. Will hold hypertensive medications including quinapril, triamterene/HCTZ and will continue her other outpatient medications which include atenolol and Norvasc.  6. Diabetes. Will place the patient on sliding scale insulin. Hold metformin due to the mild renal failure.  7. Hyperlipidemia. Continue Zocor.   8. Tobacco disorder. The patient uses tobacco very occasionally. She does not need [name] patch. She is not interested in completely quitting at this time. The patient was counseled for three minutes.   CODE STATUS: The patient is a DO NOT RESUSCITATE.       TIME SPENT: 55 minutes.   ____________________________ Janyth Contes. Juliene Pina, MD spm:drc D: 05/12/2012 16:03:21 ET T: 05/12/2012 16:21:40 ET JOB#: [id number]  cc: Jasa Dundon P. Juliene Pina, MD, <Dictator> Leo Grosser, MD Janyth Contes Eulanda Dorion MD ELECTRONICALLY SIGNED 05/12/2012 18:32

## 2015-04-17 NOTE — Discharge Summary (Signed)
PATIENT NAME:  Heidi [name], Heidi [name] MR#:  [id number] DATE OF BIRTH:  25-Mar-1927  DATE OF ADMISSION:  05/12/2012 DATE OF DISCHARGE:  05/17/2012  DISCHARGE DIAGNOSES:  1. Intractable nausea and vomiting probably secondary to medications.  2. Acute renal failure due to nausea, vomiting, and dehydration - resolved.  3. Acute pulmonary edema.  4. Malignant otitis externa.  5. Diabetes mellitus, type 2.  6. Generalized deconditioning.  7. Hypertension.  8. Anxiety.  9. Chronic obstructive pulmonary disease.   DISCHARGE MEDICATIONS:  1. Amlodipine 10 mg daily. 2. Atenolol 50 mg p.o. daily. 3. Quinapril 20 mg twice a day. 4. Spiriva 18 mcg inhalation daily.  5. Synthroid 112 mcg p.o. daily. 6. Trilipix 135 mg p.o. daily. 7. Ciprodex three drops into the left ear twice a day as needed. 8. Flonase 50 mcg two sprays daily. 9. Gabapentin 300 mg p.o. three times daily.  10. Gentamicin 0.1% topical application two times a day as needed for cellulitis of the left ear.  11. Simvastatin 40 mg/0.5 mg once a day.  12. HCTZ/triamterene 25/37.5 mg p.o. daily.  13. Zyrtec 10 mg p.o. daily.  14. Tylenol with Codeine 500/5 mg one tablet every 4 to 6 hours.   New Medicine:  1. Xanax 0.5 mg daily p.r.n. for anxiety. 2. Cefepime 1 gram IV daily, continue until she sees her ID doctor. This dose has been decreased from 2 grams to 1 gram IV daily, according to creatinine clearance. 3. Reglan 5 mg p.o. three times daily with meals.  4. Zofran 4 mg every six hours as needed for nausea. The patient was getting Zofran 4 mg twice a day before, but I changed it to every 6 hours to help with nausea and vomiting.   NOTE: Stop the following medications: Doxycycline and Cipro oral antibiotics and also metformin because she is recovering from renal failure.   FOLLOWUP: The patient needs to follow up with her ENT doctor and also ID doctor. The patient's daughter is very involved in the care and they can make the  appointments on Tuesday and follow up with them regarding continuation of further antibiotics.   CONSULTANTS: None.   HOSPITAL COURSE:  1. This is an 81 year old female patient with history of malignant otitis externa, on antibiotics since December, who came in because the patient was having vomiting and also unable to keep anything down. He did not have diarrhea, abdominal pain, or fever. The patient did have some left facial swelling. The patient was admitted for acute renal failure because of nausea and vomiting and BUN and creatinine on admission showed a BUN of 37 and creatinine 1.81. The patient was started on IV fluids along with Zofran and Phenergan. The patient was continued on IV fluids and her triamterene/HCTZ and lisinopril were stopped because of acute renal failure. The patient did have improvement with IV fluids and her kidney function started to improve. The patient's renal ultrasound did not show any acute changes,  except for an incidental cyst on the left side of her kidney, two renal cysts. The patient's urine output was good. The patient's renal function yesterday, on 05/17/2012, showed a BUN of 29 and creatinine 1.15. A month ago, in March, her renal function was normal. At that time, she was diagnosed with left ear cellulitis and after that the patient was seen by an ENT doctor at [facility]. She was diagnosed to have malignant otitis externa and her ENT doctor started her on IV antibiotics after consulting Dr. Joylene Grapes, infectious  disease physician. The patient had a PICC line and she was getting doxycycline and Cipro and also cefepime. I was thought the doxycycline was giving her some nausea. Her nausea and vomiting got better after fluids and also adding some Reglan. She is able to eat the food and tolerate the diet. Her kidney function has stabilized.  2. Antibiotics were discussed with Dr. Joylene GrapesKomer, ID doctor at [facility]. He suggested that she needs to be only on cefepime not on  doxycycline or Cipro, so I stopped the Cipro and doxycycline and told the patient's family, the daughters, to continue only cefepime, but the renal adjusted dose is 1 gram IV daily and she can continue and follow-up with Dr. Joy[facility]Komer at [facility].  3. Acute pulmonary edema. She was getting normal saline at 100 mL/h. She developed some shortness of breath and some hypoxia. Chest x-ray showed some pulmonary edema so we stopped the IV fluids and gave a dose of Lasix. The patient's echocardiogram was done and the echocardiogram showed an EF of 55% and impaired LV relaxation. We stopped the fluids and the patient felt better after giving some Lasix. She did not get anymore fluids and her kidney function did stay stable.  4. Chronic obstructive pulmonary disease. The patient did have some wheezing but did not require any steroids, but she did get some DuoNebs. Later on her lungs were clear. However her oxygen saturations are around 87% on walking and at rest they went up to 95%, but they dropped to 87% on exertion. The patient did require 1 liter of oxygen to go home with so we wrote a prescription for oxygen, 1 liter. The patient is to see her primary doctor. Dr. Lorie PhenixNancy Maloney.  5. Malignant otitis externa. The patient sees an ENT, Dr. Daneil DolinERic Krauser, at [facility]. The patient did have a mastoidectomy in December. Since then she has been having a lot of  keratin  production, according to the family and Dr. Jed Limerickross, and she is on antibiotics. She needs to follow up with them regarding further continuation of that. I spoke with Dr. Jed Limerickross and also Dr. Joylene GrapesKomer, ID doctor during my encounter with the patient and updated the family about all the findings.  6. The patient has diabetes. Her metformin is stopped because of renal failure and her sugars have been around 110 to 115. I told the family to stop metformin and recheck the kidney function with Dr. Lorie PhenixNancy Maloney and restart it at that time.  7. The patient has generalized  deconditioning and was seen by a physical therapist and they recommended home health, so we arranged for that.  8. Hyperthyroidism. The pat was continued Synthroid.  9. The patient has some abdominal pain after eating so we got an ultrasound of the abdomen which did not show any gallbladder stones or other abnormality. After some time the patient did not have any abdominal pain in the hospital, but if she gets abdominal pain after eating then she may need a HIDA scan. I explained to the family and this can be done as an outpatient.   TOTAL TIME SPENT ON DISCHARGE PREPARATION: More than 30 minutes.  ____________________________ Katha HammingSnehalatha Lynx Goodrich, MD sk:slb D: 05/18/2012 12:09:08 ET     T: 05/19/2012 14:50:39 ET        JOB#: [id number] cc: Katha HammingSnehalatha Decklan Mau, MD, <Dictator> Katha HammingSNEHALATHA Jayvier Burgher MD ELECTRONICALLY SIGNED 05/27/2012 7:27
